# Patient Record
Sex: Male | Born: 1990 | Hispanic: No | Marital: Single | Smoking: Former smoker
Health system: Southern US, Community
[De-identification: ages and names within clinical notes are randomized; demographics above are authoritative.]

## PROBLEM LIST (undated history)

## (undated) DIAGNOSIS — F419 Anxiety disorder, unspecified: Secondary | ICD-10-CM

---
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
## 2011-03-25 ENCOUNTER — Emergency Department (INDEPENDENT_AMBULATORY_CARE_PROVIDER_SITE_OTHER): Payer: Self-pay

## 2011-03-25 ENCOUNTER — Encounter (HOSPITAL_BASED_OUTPATIENT_CLINIC_OR_DEPARTMENT_OTHER): Payer: Self-pay | Admitting: *Deleted

## 2011-03-25 ENCOUNTER — Emergency Department (HOSPITAL_BASED_OUTPATIENT_CLINIC_OR_DEPARTMENT_OTHER)
Admission: EM | Admit: 2011-03-25 | Discharge: 2011-03-25 | Disposition: A | Discharge status: Home / Self Care | Payer: Self-pay | Attending: Emergency Medicine | Admitting: Emergency Medicine

## 2011-03-25 DIAGNOSIS — S71109A Unspecified open wound, unspecified thigh, initial encounter: Secondary | ICD-10-CM | POA: Insufficient documentation

## 2011-03-25 DIAGNOSIS — M79609 Pain in unspecified limb: Secondary | ICD-10-CM

## 2011-03-25 DIAGNOSIS — T148XXA Other injury of unspecified body region, initial encounter: Secondary | ICD-10-CM

## 2011-03-25 DIAGNOSIS — S71009A Unspecified open wound, unspecified hip, initial encounter: Secondary | ICD-10-CM | POA: Insufficient documentation

## 2011-03-25 DIAGNOSIS — S71112A Laceration without foreign body, left thigh, initial encounter: Secondary | ICD-10-CM

## 2011-03-25 MED ORDER — LIDOCAINE-EPINEPHRINE 2 %-1:100000 IJ SOLN
30.0000 mL | Freq: Once | INTRAMUSCULAR | Status: AC
  Administered 2011-03-25: 20 mL
  Filled 2011-03-25: qty 1

## 2011-03-25 MED ORDER — TETANUS-DIPHTH-ACELL PERTUSSIS 5-2.5-18.5 LF-MCG/0.5 IM SUSP
INTRAMUSCULAR | Status: AC
  Administered 2011-03-25: 0.5 mL via INTRAMUSCULAR
  Filled 2011-03-25: qty 0.5

## 2011-03-25 MED ORDER — TETANUS-DIPHTH-ACELL PERTUSSIS 5-2.5-18.5 LF-MCG/0.5 IM SUSP
0.5000 mL | Freq: Once | INTRAMUSCULAR | Status: AC
  Administered 2011-03-25: 0.5 mL via INTRAMUSCULAR

## 2011-03-25 MED ORDER — TETANUS-DIPHTH-ACELL PERTUSSIS 5-2-15.5 LF-MCG/0.5 IM SUSP: 0.5000 mL | Freq: Once | INTRAMUSCULAR | Status: DC

## 2011-03-25 NOTE — ED Notes (Signed)
Toe lac not present today.  Not charted as resolved from previous encounter

## 2011-03-25 NOTE — ED Notes (Addendum)
[employer] at bedside to speak with pt.  [facility] has been called by [facility]
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
## 2011-04-05 ENCOUNTER — Emergency Department (HOSPITAL_BASED_OUTPATIENT_CLINIC_OR_DEPARTMENT_OTHER)
Admission: EM | Admit: 2011-04-05 | Discharge: 2011-04-06 | Discharge status: Against Medical Advice | Payer: Self-pay | Attending: Emergency Medicine | Admitting: Emergency Medicine

## 2011-04-05 ENCOUNTER — Encounter (HOSPITAL_BASED_OUTPATIENT_CLINIC_OR_DEPARTMENT_OTHER): Payer: Self-pay | Admitting: *Deleted

## 2011-04-05 DIAGNOSIS — Z4802 Encounter for removal of sutures: Secondary | ICD-10-CM | POA: Insufficient documentation

## 2011-04-05 NOTE — ED Notes (Signed)
Called in.

## 2011-04-05 NOTE — ED Notes (Signed)
Pt seen here Dec 12th and staples were placed in his left hip. Verbalizes no complaints.

## 2011-04-05 NOTE — ED Notes (Signed)
Called in lobby and outside. No answer. 

## 2012-02-24 ENCOUNTER — Emergency Department (HOSPITAL_BASED_OUTPATIENT_CLINIC_OR_DEPARTMENT_OTHER)
Admission: EM | Admit: 2012-02-24 | Discharge: 2012-02-24 | Disposition: A | Discharge status: Home / Self Care | Payer: Self-pay | Attending: Emergency Medicine | Admitting: Emergency Medicine

## 2012-02-24 ENCOUNTER — Encounter (HOSPITAL_BASED_OUTPATIENT_CLINIC_OR_DEPARTMENT_OTHER): Payer: Self-pay | Admitting: *Deleted

## 2012-02-24 DIAGNOSIS — M542 Cervicalgia: Secondary | ICD-10-CM | POA: Insufficient documentation

## 2012-02-24 DIAGNOSIS — K921 Melena: Secondary | ICD-10-CM | POA: Insufficient documentation

## 2012-02-24 DIAGNOSIS — R112 Nausea with vomiting, unspecified: Secondary | ICD-10-CM | POA: Insufficient documentation

## 2012-02-24 DIAGNOSIS — J0300 Acute streptococcal tonsillitis, unspecified: Secondary | ICD-10-CM

## 2012-02-24 DIAGNOSIS — F17200 Nicotine dependence, unspecified, uncomplicated: Secondary | ICD-10-CM | POA: Insufficient documentation

## 2012-02-24 DIAGNOSIS — R52 Pain, unspecified: Secondary | ICD-10-CM | POA: Insufficient documentation

## 2012-02-24 DIAGNOSIS — IMO0001 Reserved for inherently not codable concepts without codable children: Secondary | ICD-10-CM | POA: Insufficient documentation

## 2012-02-24 DIAGNOSIS — J020 Streptococcal pharyngitis: Secondary | ICD-10-CM | POA: Insufficient documentation

## 2012-02-24 DIAGNOSIS — K5289 Other specified noninfective gastroenteritis and colitis: Secondary | ICD-10-CM | POA: Insufficient documentation

## 2012-02-24 DIAGNOSIS — R5381 Other malaise: Secondary | ICD-10-CM | POA: Insufficient documentation

## 2012-02-24 DIAGNOSIS — K529 Noninfective gastroenteritis and colitis, unspecified: Secondary | ICD-10-CM

## 2012-02-24 DIAGNOSIS — R197 Diarrhea, unspecified: Secondary | ICD-10-CM | POA: Insufficient documentation

## 2012-02-24 DIAGNOSIS — J029 Acute pharyngitis, unspecified: Secondary | ICD-10-CM | POA: Insufficient documentation

## 2012-02-24 LAB — CBC WITH DIFFERENTIAL/PLATELET
Basophils Absolute: 0 10*3/uL (ref 0.0–0.1)
Basophils Relative: 0 % (ref 0–1)
Eosinophils Absolute: 0.3 10*3/uL (ref 0.0–0.7)
Hemoglobin: 14.7 g/dL (ref 13.0–17.0)
Lymphocytes Relative: 7 % — ABNORMAL LOW (ref 12–46)
MCH: 28.1 pg (ref 26.0–34.0)
MCHC: 35.7 g/dL (ref 30.0–36.0)
Monocytes Absolute: 1.7 10*3/uL — ABNORMAL HIGH (ref 0.1–1.0)
Neutrophils Relative %: 81 % — ABNORMAL HIGH (ref 43–77)
RDW: 13.6 % (ref 11.5–15.5)

## 2012-02-24 LAB — COMPREHENSIVE METABOLIC PANEL
ALT: 20 U/L (ref 0–53)
AST: 23 U/L (ref 0–37)
Albumin: 4 g/dL (ref 3.5–5.2)
Alkaline Phosphatase: 80 U/L (ref 39–117)
BUN: 9 mg/dL (ref 6–23)
Chloride: 99 mEq/L (ref 96–112)
Potassium: 3.1 mEq/L — ABNORMAL LOW (ref 3.5–5.1)
Sodium: 137 mEq/L (ref 135–145)
Total Bilirubin: 0.6 mg/dL (ref 0.3–1.2)
Total Protein: 8 g/dL (ref 6.0–8.3)

## 2012-02-24 LAB — LIPASE, BLOOD: Lipase: 23 U/L (ref 11–59)

## 2012-02-24 MED ORDER — KETOROLAC TROMETHAMINE 30 MG/ML IJ SOLN
30.0000 mg | Freq: Once | INTRAMUSCULAR | Status: AC
  Administered 2012-02-24: 30 mg via INTRAVENOUS
  Filled 2012-02-24: qty 1

## 2012-02-24 MED ORDER — PENICILLIN V POTASSIUM 500 MG PO TABS: 500.0000 mg | ORAL_TABLET | Freq: Two times a day (BID) | ORAL | Status: AC

## 2012-02-24 MED ORDER — DEXAMETHASONE SODIUM PHOSPHATE 10 MG/ML IJ SOLN
10.0000 mg | Freq: Once | INTRAMUSCULAR | Status: AC
  Administered 2012-02-24: 10 mg via INTRAVENOUS
  Filled 2012-02-24: qty 1

## 2012-02-24 MED ORDER — SODIUM CHLORIDE 0.9 % IV BOLUS (SEPSIS)
1000.0000 mL | Freq: Once | INTRAVENOUS | Status: AC
  Administered 2012-02-24: 1000 mL via INTRAVENOUS

## 2012-02-24 MED ORDER — IBUPROFEN 400 MG PO TABS: 400.0000 mg | ORAL_TABLET | Freq: Four times a day (QID) | ORAL | Status: DC | PRN

## 2012-02-24 MED ORDER — ONDANSETRON 4 MG PO TBDP: 4.0000 mg | ORAL_TABLET | Freq: Three times a day (TID) | ORAL | Status: DC | PRN

## 2012-02-24 MED ORDER — POTASSIUM CHLORIDE CRYS ER 20 MEQ PO TBCR
20.0000 meq | EXTENDED_RELEASE_TABLET | Freq: Once | ORAL | Status: AC
  Administered 2012-02-24: 20 meq via ORAL
  Filled 2012-02-24: qty 1

## 2012-02-24 MED ORDER — ONDANSETRON HCL 4 MG/2ML IJ SOLN
4.0000 mg | Freq: Once | INTRAMUSCULAR | Status: AC
  Administered 2012-02-24: 4 mg via INTRAVENOUS
  Filled 2012-02-24: qty 2

## 2012-02-24 MED ORDER — CIPROFLOXACIN HCL 500 MG PO TABS: 500.0000 mg | ORAL_TABLET | Freq: Two times a day (BID) | ORAL | Status: DC

## 2012-02-24 NOTE — ED Notes (Signed)
Pt c/o flu-like symptoms x3 days

## 2012-02-24 NOTE — ED Notes (Signed)
Chart reviewed.

## 2012-02-24 NOTE — ED Provider Notes (Signed)
History     CSN: [id number]  Arrival date & time 02/24/12  1757   First MD Initiated Contact with Patient 02/24/12 1820      Chief Complaint  Patient presents with  . Fever  . Generalized Body Aches    (Consider location/radiation/quality/duration/timing/severity/associated sxs/prior treatment) HPI Pt states 2 days ago he went to a cookout and ate grilled chicken and potato salad. Yesterday morning he had multiple episodes of vomiting, several episodes of diarrhea, fever and chills. On loose stool had blood mixed in. Stools since have been normal. No current bleeding. No abdominal pain. Pt denied URI symptoms, cough, SOB, CP, abd pain. Admitted to 2 days of sore throat and myalgias. No sick contacts.  History reviewed. No pertinent past medical history.  History reviewed. No pertinent past surgical history.  History reviewed. No pertinent family history.  History  Substance Use Topics  . Smoking status: Smoker, Current Status Unknown -- 0.5 packs/day    Types: Cigarettes  . Smokeless tobacco: Not on file  . Alcohol Use: No      Review of Systems  Constitutional: Positive for fever, chills and fatigue.  HENT: Positive for sore throat and neck pain. Negative for rhinorrhea, sneezing, neck stiffness, voice change and sinus pressure.   Respiratory: Negative for cough, shortness of breath and wheezing.   Cardiovascular: Negative for chest pain, palpitations and leg swelling.  Gastrointestinal: Positive for nausea, vomiting, diarrhea and blood in stool. Negative for abdominal pain, constipation and abdominal distention.  Genitourinary: Negative for dysuria and flank pain.  Musculoskeletal: Positive for myalgias. Negative for back pain and arthralgias.  Neurological: Negative for dizziness, syncope, weakness, numbness and headaches.    Allergies  Shellfish allergy  Home Medications   Current Outpatient Rx  Name  Route  Sig  Dispense  Refill  . CIPROFLOXACIN HCL 500 MG PO  TABS   Oral   Take 1 tablet (500 mg total) by mouth 2 (two) times daily.   6 tablet   0   . IBUPROFEN 400 MG PO TABS   Oral   Take 1 tablet (400 mg total) by mouth every 6 (six) hours as needed for pain.   30 tablet   0   . ONDANSETRON 4 MG PO TBDP   Oral   Take 1 tablet (4 mg total) by mouth every 8 (eight) hours as needed for nausea.   20 tablet   0   . PENICILLIN V POTASSIUM 500 MG PO TABS   Oral   Take 1 tablet (500 mg total) by mouth 2 (two) times daily.   20 tablet   0     BP 123/69  Pulse 100  Temp 99.1 F (37.3 C) (Oral)  Resp 16  Ht 6\' 3"  (1.905 m)  Wt 238 lb (107.956 kg)  BMI 29.75 kg/m2  SpO2 98%  Physical Exam  Nursing note and vitals reviewed. Constitutional: He is oriented to person, place, and time. He appears well-developed and well-nourished. No distress.  HENT:  Head: Normocephalic and atraumatic.       bl tosillar exudate. No obvious PTA or uvular deviation  Eyes: EOM are normal. Pupils are equal, round, and reactive to light.  Neck: Normal range of motion. Neck supple.  Cardiovascular: Normal rate and regular rhythm.   Pulmonary/Chest: Effort normal and breath sounds normal. No respiratory distress. He has no wheezes. He has no rales.  Abdominal: Soft. Bowel sounds are normal. He exhibits no distension and no mass. There is no  tenderness. There is no rebound and no guarding.  Musculoskeletal: Normal range of motion. He exhibits no edema and no tenderness.  Lymphadenopathy:    He has cervical adenopathy.  Neurological: He is alert and oriented to person, place, and time.       5/5 motor in all ext. Sensation intact  Skin: Skin is warm and dry. No rash noted. No erythema.  Psychiatric: He has a normal mood and affect. His behavior is normal.    ED Course  Procedures (including critical care time)  Labs Reviewed  CBC WITH DIFFERENTIAL - Abnormal; Notable for the following:    WBC 17.0 (*)     Neutrophils Relative 81 (*)     Lymphocytes  Relative 7 (*)     Neutro Abs 13.8 (*)     Monocytes Absolute 1.7 (*)     All other components within normal limits  COMPREHENSIVE METABOLIC PANEL - Abnormal; Notable for the following:    Potassium 3.1 (*)     All other components within normal limits  LIPASE, BLOOD   No results found.   1. Strep tonsillitis   2. Enteritis       MDM  Pt states he is feeling much better after meds and fluids. Strep throat and I suspect possible food-bourne bacterial enteritis. Will treat for both. F/u for worsening symptoms or concerns        Loren Racer, MD 02/24/12 2102

## 2012-05-14 ENCOUNTER — Emergency Department (HOSPITAL_COMMUNITY)
Admission: EM | Admit: 2012-05-14 | Discharge: 2012-05-14 | Disposition: A | Discharge status: Home / Self Care | Payer: Self-pay | Attending: Emergency Medicine | Admitting: Emergency Medicine

## 2012-05-14 ENCOUNTER — Emergency Department (HOSPITAL_COMMUNITY): Payer: Self-pay

## 2012-05-14 ENCOUNTER — Encounter (HOSPITAL_COMMUNITY): Payer: Self-pay | Admitting: Physical Medicine and Rehabilitation

## 2012-05-14 DIAGNOSIS — R0681 Apnea, not elsewhere classified: Secondary | ICD-10-CM | POA: Insufficient documentation

## 2012-05-14 DIAGNOSIS — S022XXA Fracture of nasal bones, initial encounter for closed fracture: Secondary | ICD-10-CM | POA: Insufficient documentation

## 2012-05-14 DIAGNOSIS — R404 Transient alteration of awareness: Secondary | ICD-10-CM | POA: Insufficient documentation

## 2012-05-14 DIAGNOSIS — F17200 Nicotine dependence, unspecified, uncomplicated: Secondary | ICD-10-CM | POA: Insufficient documentation

## 2012-05-14 DIAGNOSIS — F1010 Alcohol abuse, uncomplicated: Secondary | ICD-10-CM | POA: Insufficient documentation

## 2012-05-14 MED ORDER — OXYCODONE-ACETAMINOPHEN 5-325 MG PO TABS: 1.0000 | ORAL_TABLET | ORAL | Status: DC | PRN

## 2012-05-14 MED ORDER — OXYCODONE-ACETAMINOPHEN 5-325 MG PO TABS
2.0000 | ORAL_TABLET | Freq: Once | ORAL | Status: AC
  Administered 2012-05-14: 2 via ORAL
  Filled 2012-05-14: qty 2

## 2012-05-14 MED ORDER — OXYMETAZOLINE HCL 0.05 % NA SOLN
1.0000 | Freq: Once | NASAL | Status: AC
  Administered 2012-05-14: 1 via NASAL
  Filled 2012-05-14: qty 15
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
## 2012-05-15 NOTE — ED Provider Notes (Signed)
Medical screening examination/treatment/procedure(s) were performed by non-physician practitioner and as supervising physician I was immediately available for consultation/collaboration.    Nelia Shi, MD 05/15/12 [phone]

## 2012-12-19 ENCOUNTER — Emergency Department (HOSPITAL_BASED_OUTPATIENT_CLINIC_OR_DEPARTMENT_OTHER)
Admission: EM | Admit: 2012-12-19 | Discharge: 2012-12-19 | Disposition: A | Discharge status: Home / Self Care | Payer: Self-pay | Attending: Emergency Medicine | Admitting: Emergency Medicine

## 2012-12-19 ENCOUNTER — Encounter (HOSPITAL_BASED_OUTPATIENT_CLINIC_OR_DEPARTMENT_OTHER): Payer: Self-pay | Admitting: *Deleted

## 2012-12-19 DIAGNOSIS — F17200 Nicotine dependence, unspecified, uncomplicated: Secondary | ICD-10-CM | POA: Insufficient documentation

## 2012-12-19 DIAGNOSIS — J029 Acute pharyngitis, unspecified: Secondary | ICD-10-CM

## 2012-12-19 DIAGNOSIS — R112 Nausea with vomiting, unspecified: Secondary | ICD-10-CM | POA: Insufficient documentation

## 2012-12-19 DIAGNOSIS — IMO0001 Reserved for inherently not codable concepts without codable children: Secondary | ICD-10-CM | POA: Insufficient documentation

## 2012-12-19 DIAGNOSIS — R509 Fever, unspecified: Secondary | ICD-10-CM | POA: Insufficient documentation

## 2012-12-19 MED ORDER — ONDANSETRON HCL 4 MG PO TABS: 4.0000 mg | ORAL_TABLET | Freq: Three times a day (TID) | ORAL | Status: DC | PRN

## 2012-12-19 MED ORDER — ONDANSETRON 4 MG PO TBDP
4.0000 mg | ORAL_TABLET | Freq: Once | ORAL | Status: AC
  Administered 2012-12-19: 4 mg via ORAL
  Filled 2012-12-19: qty 1

## 2012-12-19 NOTE — ED Provider Notes (Signed)
CSN: [id number]     Arrival date & time 12/19/12  4098 History   First MD Initiated Contact with Patient 12/19/12 [phone]     Chief Complaint  Patient presents with  . fever chills bodyaches   . Sore Throat   (Consider location/radiation/quality/duration/timing/severity/associated sxs/prior Treatment) Patient is a 22 y.o. male presenting with pharyngitis.  Sore Throat   Pt reports 2 days of moderate aching sore throat worse with swallowing, nausea and subjective fever. Vomiting twice yesterday and no appetite today. Has had strep pharyngitis in the past.   History reviewed. No pertinent past medical history. History reviewed. No pertinent past surgical history. History reviewed. No pertinent family history. History  Substance Use Topics  . Smoking status: Smoker, Current Status Unknown -- 0.50 packs/day    Types: Cigarettes  . Smokeless tobacco: Not on file  . Alcohol Use: No    Review of Systems All other systems reviewed and are negative except as noted in HPI.   Allergies  Shellfish allergy  Home Medications   Current Outpatient Rx  Name  Route  Sig  Dispense  Refill  . oxyCODONE-acetaminophen (PERCOCET/ROXICET) 5-325 MG per tablet   Oral   Take 1 tablet by mouth every 4 (four) hours as needed for pain. May take 2 tablets PO q 6 hours for severe pain - Do not take with Tylenol as this tablet already contains tylenol   15 tablet   0    BP 120/73  Pulse 92  Temp(Src) 98.5 F (36.9 C) (Oral)  Resp 20  SpO2 100% Physical Exam  Nursing note and vitals reviewed. Constitutional: He is oriented to person, place, and time. He appears well-developed and well-nourished.  HENT:  Head: Normocephalic and atraumatic.  Moderate tonsillar swelling, mild erythema, no exudate  Eyes: EOM are normal. Pupils are equal, round, and reactive to light.  Neck: Normal range of motion. Neck supple.  Cardiovascular: Normal rate, normal heart sounds and intact distal pulses.    Pulmonary/Chest: Effort normal and breath sounds normal. He has no wheezes. He has no rales.  Abdominal: Bowel sounds are normal. He exhibits no distension. There is no tenderness.  Musculoskeletal: Normal range of motion. He exhibits no edema and no tenderness.  Lymphadenopathy:    He has no cervical adenopathy.  Neurological: He is alert and oriented to person, place, and time. He has normal strength. No cranial nerve deficit or sensory deficit.  Skin: Skin is warm and dry. No rash noted.  Psychiatric: He has a normal mood and affect.    ED Course  Procedures (including critical care time) Labs Review Labs Reviewed  RAPID STREP SCREEN   Imaging Review No results found.  MDM   1. Viral pharyngitis     Strep neg, likely a non-specific viral illness. Advised symptomatic OTC care. Zofran Rx as needed for nausea.    Charles B. Bernette Mayers, MD 12/19/12 [phone]

## 2012-12-19 NOTE — ED Notes (Signed)
Pt reports yesterday started having sore throat fever and chills vomited x 2 yesterday no vomiting today

## 2012-12-20 ENCOUNTER — Emergency Department (HOSPITAL_BASED_OUTPATIENT_CLINIC_OR_DEPARTMENT_OTHER)
Admission: EM | Admit: 2012-12-20 | Discharge: 2012-12-20 | Disposition: A | Discharge status: Home / Self Care | Payer: Self-pay | Attending: Emergency Medicine | Admitting: Emergency Medicine

## 2012-12-20 ENCOUNTER — Encounter (HOSPITAL_BASED_OUTPATIENT_CLINIC_OR_DEPARTMENT_OTHER): Payer: Self-pay | Admitting: *Deleted

## 2012-12-20 DIAGNOSIS — R509 Fever, unspecified: Secondary | ICD-10-CM | POA: Insufficient documentation

## 2012-12-20 DIAGNOSIS — F17200 Nicotine dependence, unspecified, uncomplicated: Secondary | ICD-10-CM | POA: Insufficient documentation

## 2012-12-20 DIAGNOSIS — R110 Nausea: Secondary | ICD-10-CM | POA: Insufficient documentation

## 2012-12-20 DIAGNOSIS — R630 Anorexia: Secondary | ICD-10-CM | POA: Insufficient documentation

## 2012-12-20 DIAGNOSIS — IMO0001 Reserved for inherently not codable concepts without codable children: Secondary | ICD-10-CM | POA: Insufficient documentation

## 2012-12-20 DIAGNOSIS — R0602 Shortness of breath: Secondary | ICD-10-CM | POA: Insufficient documentation

## 2012-12-20 DIAGNOSIS — J039 Acute tonsillitis, unspecified: Secondary | ICD-10-CM | POA: Insufficient documentation

## 2012-12-20 DIAGNOSIS — R42 Dizziness and giddiness: Secondary | ICD-10-CM | POA: Insufficient documentation

## 2012-12-20 MED ORDER — PENICILLIN G BENZATHINE 1200000 UNIT/2ML IM SUSP
1.2000 10*6.[IU] | Freq: Once | INTRAMUSCULAR | Status: AC
  Administered 2012-12-20: 1.2 10*6.[IU] via INTRAMUSCULAR
  Filled 2012-12-20: qty 2

## 2012-12-20 MED ORDER — TRAMADOL HCL 50 MG PO TABS: 50.0000 mg | ORAL_TABLET | Freq: Four times a day (QID) | ORAL | Status: DC | PRN

## 2012-12-20 MED ORDER — PROMETHAZINE HCL 25 MG PO TABS: 25.0000 mg | ORAL_TABLET | Freq: Four times a day (QID) | ORAL | Status: DC | PRN

## 2012-12-20 MED ORDER — DEXAMETHASONE SODIUM PHOSPHATE 10 MG/ML IJ SOLN
10.0000 mg | Freq: Once | INTRAMUSCULAR | Status: AC
  Administered 2012-12-20: 10 mg via INTRAMUSCULAR
  Filled 2012-12-20: qty 1

## 2012-12-20 NOTE — ED Provider Notes (Signed)
CSN: [id number]     Arrival date & time 12/20/12  2142 History  This chart was scribed for Gilda Crease, MD by Bennett Scrape, ED Scribe. This patient was seen in room MH01/MH01 and the patient's care was started at 10:13 PM.   Chief Complaint  Patient presents with  . Sore Throat    The history is provided by the patient. No language interpreter was used.    HPI Comments: Jeremy Horn is a 22 y.o. male who presents to the Emergency Department complaining of persistent, worsening sore throat that has been persistent for the past 3 days. He was seen yesterday for the same and diagnosed with a viral illness. He admits that he did not get the Zofran filled due to cost. He is back today after developing a "bump" on his throat. He reports associated decreased appetite, myalgias, nausea, SOB, fever, chills and dizziness with standing. Temperature is 99.8 in the ED. He denies abdominal pain and emesis as associated symptoms. Pt does not have a h/o chronic medical conditions.    History reviewed. No pertinent past medical history. History reviewed. No pertinent past surgical history. History reviewed. No pertinent family history. History  Substance Use Topics  . Smoking status: Smoker, Current Status Unknown -- 0.50 packs/day    Types: Cigarettes  . Smokeless tobacco: Not on file  . Alcohol Use: No    Review of Systems  Constitutional: Positive for fever, chills and appetite change.  HENT: Positive for sore throat.   Respiratory: Positive for shortness of breath. Negative for cough.   Gastrointestinal: Positive for nausea. Negative for vomiting.  Musculoskeletal: Positive for myalgias.  Neurological: Positive for dizziness. Negative for headaches.  All other systems reviewed and are negative.    Allergies  Shellfish allergy  Home Medications   Current Outpatient Rx  Name  Route  Sig  Dispense  Refill  . ondansetron (ZOFRAN) 4 MG tablet   Oral   Take 1 tablet (4  mg total) by mouth every 8 (eight) hours as needed for nausea.   12 tablet   0    Triage Vitals: BP 112/70  Pulse 110  Temp(Src) 99.8 F (37.7 C) (Oral)  Resp 16  Ht 6\' 3"  (1.905 m)  Wt 239 lb (108.41 kg)  BMI 29.87 kg/m2  SpO2 100%  Physical Exam  Nursing note and vitals reviewed. Constitutional: He is oriented to person, place, and time. He appears well-developed and well-nourished. No distress.  HENT:  Head: Normocephalic and atraumatic.  Right Ear: Hearing normal.  Left Ear: Hearing normal.  Nose: Nose normal.  Mouth/Throat: Mucous membranes are normal.  fairly significant tonsillar exudate with peritonsillar swelling, no abscess  Eyes: Conjunctivae and EOM are normal. Pupils are equal, round, and reactive to light.  Neck: Normal range of motion. Neck supple.  Cardiovascular: Regular rhythm, S1 normal and S2 normal.  Exam reveals no gallop and no friction rub.   No murmur heard. Pulmonary/Chest: Effort normal and breath sounds normal. No respiratory distress. He exhibits no tenderness.  Musculoskeletal: Normal range of motion.  Lymphadenopathy:    He has cervical adenopathy.  Neurological: He is alert and oriented to person, place, and time. He has normal strength. No cranial nerve deficit or sensory deficit. Coordination normal. GCS eye subscore is 4. GCS verbal subscore is 5. GCS motor subscore is 6.  Skin: Skin is warm, dry and intact. No rash noted. No cyanosis.  Psychiatric: He has a normal mood and affect. His speech  is normal and behavior is normal. Thought content normal.    ED Course  Procedures (including critical care time)  DIAGNOSTIC STUDIES: Oxygen Saturation is 100% on room air, normal by my interpretation.    COORDINATION OF CARE: 10:16 PM-Discussed treatment plan which includes Tylenol for pain, cough medication and other medications with pt at bedside and pt agreed to plan.   Labs Review Labs Reviewed - No data to display Imaging Review No  results found.  MDM  Diagnosis: Pharyngitis, tonsillitis  Patient presents to the ER for evaluation of continued problems with sore throat. Patient seen yesterday, rapid strep was negative. Patient reports that he continues to have fever, generalized aches and a sore throat. He is complaining of pain with swallowing. Examination reveals significant exudative tonsillitis. Patient treated with Decadron and Bicillin.  I personally performed the services described in this documentation, which was scribed in my presence. The recorded information has been reviewed and is accurate.     Gilda Crease, MD 12/20/12 2226

## 2012-12-20 NOTE — ED Notes (Signed)
Se here yesterday for same , back today for increased sore throat and chills, dizziness

## 2012-12-21 LAB — CULTURE, GROUP A STREP

## 2013-07-06 ENCOUNTER — Encounter (HOSPITAL_BASED_OUTPATIENT_CLINIC_OR_DEPARTMENT_OTHER): Payer: Self-pay | Admitting: Emergency Medicine

## 2013-07-06 ENCOUNTER — Emergency Department (HOSPITAL_BASED_OUTPATIENT_CLINIC_OR_DEPARTMENT_OTHER)
Admission: EM | Admit: 2013-07-06 | Discharge: 2013-07-06 | Disposition: A | Discharge status: Home / Self Care | Payer: Self-pay | Attending: Emergency Medicine | Admitting: Emergency Medicine

## 2013-07-06 DIAGNOSIS — F17200 Nicotine dependence, unspecified, uncomplicated: Secondary | ICD-10-CM | POA: Insufficient documentation

## 2013-07-06 DIAGNOSIS — R1110 Vomiting, unspecified: Secondary | ICD-10-CM | POA: Insufficient documentation

## 2013-07-06 DIAGNOSIS — R197 Diarrhea, unspecified: Secondary | ICD-10-CM | POA: Insufficient documentation

## 2013-07-06 LAB — BASIC METABOLIC PANEL
BUN: 11 mg/dL (ref 6–23)
CALCIUM: 9.3 mg/dL (ref 8.4–10.5)
CO2: 23 meq/L (ref 19–32)
CREATININE: 0.8 mg/dL (ref 0.50–1.35)
Chloride: 102 mEq/L (ref 96–112)
GFR calc Af Amer: >90 mL/min (ref >90)
GFR calc non Af Amer: >90 mL/min (ref >90)
GLUCOSE: 120 mg/dL — AB (ref 70–99)
Potassium: 3.4 mEq/L — ABNORMAL LOW (ref 3.7–5.3)
SODIUM: 139 meq/L (ref 137–147)

## 2013-07-06 MED ORDER — ONDANSETRON 4 MG PO TBDP: 4.0000 mg | ORAL_TABLET | Freq: Three times a day (TID) | ORAL | Status: DC | PRN

## 2013-07-06 MED ORDER — ONDANSETRON HCL 4 MG/2ML IJ SOLN
4.0000 mg | Freq: Once | INTRAMUSCULAR | Status: AC
  Administered 2013-07-06: 4 mg via INTRAVENOUS
  Filled 2013-07-06: qty 2

## 2013-07-06 MED ORDER — DICYCLOMINE HCL 10 MG/ML IM SOLN
20.0000 mg | Freq: Once | INTRAMUSCULAR | Status: AC
  Administered 2013-07-06: 20 mg via INTRAMUSCULAR
  Filled 2013-07-06: qty 2

## 2013-07-06 MED ORDER — SODIUM CHLORIDE 0.9 % IV BOLUS (SEPSIS)
1000.0000 mL | Freq: Once | INTRAVENOUS | Status: AC
  Administered 2013-07-06: 1000 mL via INTRAVENOUS

## 2013-07-06 NOTE — Discharge Instructions (Signed)

## 2013-07-06 NOTE — ED Provider Notes (Signed)
Medical screening examination/treatment/procedure(s) were performed by non-physician practitioner and as supervising physician I was immediately available for consultation/collaboration.   EKG Interpretation None        Joshuajames Moehring, MD 07/06/13 1817 

## 2013-07-06 NOTE — ED Provider Notes (Signed)
CSN: [id number]     Arrival date & time 07/06/13  1609 History   First MD Initiated Contact with Patient 07/06/13 1619     Chief Complaint  Patient presents with  . Abdominal Pain     (Consider location/radiation/quality/duration/timing/severity/associated sxs/prior Treatment) HPI Comments: Pt c/o vomiting and diarrhea that started last night about 1 hour after eating. States that he has been unable to keep much down today. Diarrhea has stopped. Denies fever. Having diffuse cramping. Tried pepto without any problem  The history is provided by the patient. No language interpreter was used.    History reviewed. No pertinent past medical history. History reviewed. No pertinent past surgical history. No family history on file. History  Substance Use Topics  . Smoking status: Smoker, Current Status Unknown -- 0.50 packs/day    Types: Cigarettes  . Smokeless tobacco: Not on file  . Alcohol Use: No    Review of Systems  Constitutional: Negative.   Respiratory: Negative.   Cardiovascular: Negative.       Allergies  Shellfish allergy  Home Medications  No current outpatient prescriptions on file. BP 120/67  Pulse 89  Temp(Src) 98.1 F (36.7 C) (Oral)  Resp 18  Ht 6\' 3"  (1.905 m)  Wt 263 lb (119.296 kg)  BMI 32.87 kg/m2  SpO2 100% Physical Exam  Nursing note and vitals reviewed. Constitutional: He is oriented to person, place, and time. He appears well-developed and well-nourished.  Cardiovascular: Normal rate and regular rhythm.   Pulmonary/Chest: Effort normal and breath sounds normal.  Abdominal: Soft. Bowel sounds are normal. There is no tenderness.  Musculoskeletal: Normal range of motion.  Neurological: He is alert and oriented to person, place, and time.  Skin: Skin is warm and dry.    ED Course  Procedures (including critical care time) Labs Review Labs Reviewed  BASIC METABOLIC PANEL - Abnormal; Notable for the following:    Potassium 3.4 (*)    Glucose, Bld 120 (*)    All other components within normal limits   Imaging Review No results found.   EKG Interpretation None      MDM   Final diagnoses:  Vomiting and diarrhea    Pt is feeling better and tolerating po after fluids and zofran. Likely viral.     Teressa LowerVrinda Tasnim Balentine, NP 07/06/13 1742
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
## 2013-07-25 ENCOUNTER — Encounter (HOSPITAL_BASED_OUTPATIENT_CLINIC_OR_DEPARTMENT_OTHER): Payer: Self-pay | Admitting: Emergency Medicine

## 2013-07-25 DIAGNOSIS — R05 Cough: Secondary | ICD-10-CM | POA: Insufficient documentation

## 2013-07-25 DIAGNOSIS — F17200 Nicotine dependence, unspecified, uncomplicated: Secondary | ICD-10-CM | POA: Insufficient documentation

## 2013-07-25 DIAGNOSIS — R059 Cough, unspecified: Secondary | ICD-10-CM | POA: Insufficient documentation

## 2013-07-25 DIAGNOSIS — R1110 Vomiting, unspecified: Secondary | ICD-10-CM | POA: Insufficient documentation

## 2013-07-25 DIAGNOSIS — R197 Diarrhea, unspecified: Secondary | ICD-10-CM | POA: Insufficient documentation

## 2013-07-25 MED ORDER — ACETAMINOPHEN 325 MG PO TABS
650.0000 mg | ORAL_TABLET | Freq: Once | ORAL | Status: AC
  Administered 2013-07-25: 650 mg via ORAL
  Filled 2013-07-25: qty 2

## 2013-07-25 MED ORDER — ONDANSETRON 8 MG PO TBDP
8.0000 mg | ORAL_TABLET | Freq: Once | ORAL | Status: AC
  Administered 2013-07-25: 8 mg via ORAL
  Filled 2013-07-25: qty 1

## 2013-07-25 NOTE — ED Notes (Signed)
Vomited x 2, diarrhea x3. Cough and cold sxs as well.

## 2013-07-26 ENCOUNTER — Emergency Department (HOSPITAL_BASED_OUTPATIENT_CLINIC_OR_DEPARTMENT_OTHER)
Admission: EM | Admit: 2013-07-26 | Discharge: 2013-07-26 | Discharge status: Against Medical Advice | Payer: Self-pay | Attending: Emergency Medicine | Admitting: Emergency Medicine

## 2013-07-27 ENCOUNTER — Encounter (HOSPITAL_BASED_OUTPATIENT_CLINIC_OR_DEPARTMENT_OTHER): Payer: Self-pay | Admitting: Emergency Medicine

## 2013-07-27 ENCOUNTER — Emergency Department (HOSPITAL_BASED_OUTPATIENT_CLINIC_OR_DEPARTMENT_OTHER)
Admission: EM | Admit: 2013-07-27 | Discharge: 2013-07-27 | Disposition: A | Discharge status: Home / Self Care | Payer: Self-pay | Attending: Emergency Medicine | Admitting: Emergency Medicine

## 2013-07-27 DIAGNOSIS — B349 Viral infection, unspecified: Secondary | ICD-10-CM

## 2013-07-27 DIAGNOSIS — R110 Nausea: Secondary | ICD-10-CM | POA: Insufficient documentation

## 2013-07-27 DIAGNOSIS — J3489 Other specified disorders of nose and nasal sinuses: Secondary | ICD-10-CM | POA: Insufficient documentation

## 2013-07-27 DIAGNOSIS — F17200 Nicotine dependence, unspecified, uncomplicated: Secondary | ICD-10-CM | POA: Insufficient documentation

## 2013-07-27 DIAGNOSIS — R197 Diarrhea, unspecified: Secondary | ICD-10-CM | POA: Insufficient documentation

## 2013-07-27 DIAGNOSIS — B9789 Other viral agents as the cause of diseases classified elsewhere: Secondary | ICD-10-CM | POA: Insufficient documentation

## 2013-07-27 DIAGNOSIS — R509 Fever, unspecified: Secondary | ICD-10-CM | POA: Insufficient documentation

## 2013-07-27 MED ORDER — GUAIFENESIN-CODEINE 100-10 MG/5ML PO SOLN: 10.0000 mL | Freq: Four times a day (QID) | ORAL | Status: DC | PRN

## 2013-07-27 NOTE — ED Notes (Signed)
Reports intermittent 3 day hx of Nausea, fever, headcold.

## 2013-07-27 NOTE — Discharge Instructions (Signed)
Robitussin with codeine as needed for cough.  Motrin 600 mg every 6 hours as needed for fever or pain.  Return to the ER if you develop difficulty breathing, bloody stool, or other new and concerning symptoms.   Viral Infections A viral infection can be caused by different types of viruses.Most viral infections are not serious and resolve on their own. However, some infections may cause severe symptoms and may lead to further complications. SYMPTOMS Viruses can frequently cause:  Minor sore throat.  Aches and pains.  Headaches.  Runny nose.  Different types of rashes.  Watery eyes.  Tiredness.  Cough.  Loss of appetite.  Gastrointestinal infections, resulting in nausea, vomiting, and diarrhea. These symptoms do not respond to antibiotics because the infection is not caused by bacteria. However, you might catch a bacterial infection following the viral infection. This is sometimes called a "superinfection." Symptoms of such a bacterial infection may include:  Worsening sore throat with pus and difficulty swallowing.  Swollen neck glands.  Chills and a high or persistent fever.  Severe headache.  Tenderness over the sinuses.  Persistent overall ill feeling (malaise), muscle aches, and tiredness (fatigue).  Persistent cough.  Yellow, green, or brown mucus production with coughing. HOME CARE INSTRUCTIONS   Only take over-the-counter or prescription medicines for pain, discomfort, diarrhea, or fever as directed by your caregiver.  Drink enough water and fluids to keep your urine clear or pale yellow. Sports drinks can provide valuable electrolytes, sugars, and hydration.  Get plenty of rest and maintain proper nutrition. Soups and broths with crackers or rice are fine. SEEK IMMEDIATE MEDICAL CARE IF:   You have severe headaches, shortness of breath, chest pain, neck pain, or an unusual rash.  You have uncontrolled vomiting, diarrhea, or you are unable to keep  down fluids.  You or your child has an oral temperature above 102 F (38.9 C), not controlled by medicine.  Your baby is older than 3 months with a rectal temperature of 102 F (38.9 C) or higher.  Your baby is 583 months old or younger with a rectal temperature of 100.4 F (38 C) or higher. MAKE SURE YOU:   Understand these instructions.  Will watch your condition.  Will get help right away if you are not doing well or get worse. Document Released: 01/07/2005 Document Revised: 06/22/2011 Document Reviewed: 08/04/2010 [facility] Patient Information 2014 [place]Care, [place].

## 2013-07-27 NOTE — ED Provider Notes (Signed)
CSN: [id number]     Arrival date & time 07/27/13  [id number] History   First MD Initiated Contact with Patient 07/27/13 1037     Chief Complaint  Patient presents with  . URI     (Consider location/radiation/quality/duration/timing/severity/associated sxs/prior Treatment) HPI Comments: Patient is a 23 year old male who presents with a three-day history of upper respiratory symptoms. Also reports some nausea and diarrhea. He has had low-grade fevers at home. He denies any ill contacts. He is able to tolerate liquids and is urinating normally. He has not had much appetite and has not taken in much solid food.  Patient is a 23 y.o. male presenting with URI. The history is provided by the patient.  URI Presenting symptoms: congestion and cough   Severity:  Moderate Onset quality:  Gradual Duration:  3 days Timing:  Constant Progression:  Worsening Chronicity:  New Relieved by:  Nothing Worsened by:  Nothing tried Ineffective treatments:  None tried   History reviewed. No pertinent past medical history. History reviewed. No pertinent past surgical history. History reviewed. No pertinent family history. History  Substance Use Topics  . Smoking status: Smoker, Current Status Unknown -- 0.50 packs/day    Types: Cigarettes  . Smokeless tobacco: Not on file  . Alcohol Use: Yes    Review of Systems  HENT: Positive for congestion.   Respiratory: Positive for cough.   All other systems reviewed and are negative.     Allergies  Shellfish allergy  Home Medications   Prior to Admission medications   Medication Sig Start Date End Date Taking? Authorizing Provider  ondansetron (ZOFRAN ODT) 4 MG disintegrating tablet Take 1 tablet (4 mg total) by mouth every 8 (eight) hours as needed for nausea or vomiting. 07/06/13   Teressa LowerVrinda Pickering, NP   BP 147/83  Pulse 92  Temp(Src) 98.3 F (36.8 C) (Oral)  Resp 18  Ht 6\' 3"  (1.905 m)  Wt 248 lb (112.492 kg)  BMI 31.00 kg/m2  SpO2  97% Physical Exam  Nursing note and vitals reviewed. Constitutional: He is oriented to person, place, and time. He appears well-developed and well-nourished. No distress.  Awake, alert, nontoxic appearance.  HENT:  Head: Normocephalic and atraumatic.  Mouth/Throat: Oropharynx is clear and moist.  Eyes: Right eye exhibits no discharge. Left eye exhibits no discharge.  Neck: Neck supple.  Pulmonary/Chest: Effort normal. He has no wheezes. He has no rales. He exhibits no tenderness.  Abdominal: Soft. There is no tenderness. There is no rebound.  Musculoskeletal: Normal range of motion. He exhibits no tenderness.  Baseline ROM, no obvious new focal weakness.  Neurological: He is alert and oriented to person, place, and time.  Mental status and motor strength appears baseline for patient and situation.  Skin: No rash noted. He is not diaphoretic.  Psychiatric: He has a normal mood and affect.    ED Course  Procedures (including critical care time) Labs Review Labs Reviewed - No data to display  Imaging Review No results found.   EKG Interpretation None      MDM   Final diagnoses:  None    Symptoms sound viral in nature. He appears well-hydrated and abdominal exam is benign. His lungs are clear and oxygen concentration is 97%. I've advised him this will likely take several more days to run its course. His worse symptom is his cough which has kept him up at night. I will prescribe Robitussin with codeine she can take for this. He understands to return if he  develops worsening or changing symptoms.    Geoffery Lyonsouglas Onofre Gains, MD 07/27/13 [phone]

## 2014-03-13 ENCOUNTER — Emergency Department (HOSPITAL_BASED_OUTPATIENT_CLINIC_OR_DEPARTMENT_OTHER)
Admission: EM | Admit: 2014-03-13 | Discharge: 2014-03-13 | Disposition: A | Discharge status: Home / Self Care | Payer: Self-pay | Attending: Emergency Medicine | Admitting: Emergency Medicine

## 2014-03-13 ENCOUNTER — Encounter (HOSPITAL_BASED_OUTPATIENT_CLINIC_OR_DEPARTMENT_OTHER): Payer: Self-pay | Admitting: *Deleted

## 2014-03-13 DIAGNOSIS — H9201 Otalgia, right ear: Secondary | ICD-10-CM | POA: Insufficient documentation

## 2014-03-13 DIAGNOSIS — Z79899 Other long term (current) drug therapy: Secondary | ICD-10-CM | POA: Insufficient documentation

## 2014-03-13 DIAGNOSIS — Z720 Tobacco use: Secondary | ICD-10-CM | POA: Insufficient documentation

## 2014-03-13 MED ORDER — AMOXICILLIN 500 MG PO CAPS
1000.0000 mg | ORAL_CAPSULE | Freq: Once | ORAL | Status: AC
  Administered 2014-03-13: 1000 mg via ORAL
  Filled 2014-03-13: qty 2

## 2014-03-13 MED ORDER — AMOXICILLIN 500 MG PO CAPS: 1000.0000 mg | ORAL_CAPSULE | Freq: Three times a day (TID) | ORAL | Status: DC

## 2014-03-13 MED ORDER — ANTIPYRINE-BENZOCAINE 5.4-1.4 % OT SOLN: 4.0000 [drp] | OTIC | Status: DC | PRN

## 2014-03-13 NOTE — ED Notes (Signed)
R ear pain, woke pt up, pain worse tonight, onset yesterday, has been using gtts to unclog ear wax, no meds for pain PTA, rates 8/10, (denies: dizziness, hearing changes, nv, injury, Q-tip use or fever)

## 2014-03-13 NOTE — ED Notes (Signed)
Alert, NAD, calm, interactive, steady gait.  

## 2014-03-13 NOTE — ED Provider Notes (Signed)
CSN: [id number]     Arrival date & time 03/13/14  0449 History   First MD Initiated Contact with Patient 03/13/14 0501     Chief Complaint  Patient presents with  . Ear Pain      (Consider location/radiation/quality/duration/timing/severity/associated sxs/prior Treatment) HPI  This is a 23 year old male who has been treating a perceived right cerumen impaction with an over-the-counter earwax removal kit. He has been doing this for the past 3 days. This morning he awoke from sleep with moderate to severe pain in his right ear. He also has a sensation that his voice is carrying into his right ear. He denies tinnitus. He denies vertigo. He denies drainage from the ear. He denies pain on movement of the right ear. He denies fever.  History reviewed. No pertinent past medical history. History reviewed. No pertinent past surgical history. No family history on file. History  Substance Use Topics  . Smoking status: Smoker, Current Status Unknown -- 0.50 packs/day    Types: Cigarettes  . Smokeless tobacco: Not on file  . Alcohol Use: Yes    Review of Systems  All other systems reviewed and are negative.   Allergies  Shellfish allergy  Home Medications   Prior to Admission medications   Medication Sig Start Date End Date Taking? Authorizing Provider  guaiFENesin-codeine 100-10 MG/5ML syrup Take 10 mLs by mouth every 6 (six) hours as needed for cough. 07/27/13   Veryl Speak, MD  ondansetron (ZOFRAN ODT) 4 MG disintegrating tablet Take 1 tablet (4 mg total) by mouth every 8 (eight) hours as needed for nausea or vomiting. 07/06/13   Glendell Docker, NP   BP 118/70 mmHg  Pulse 88  Temp(Src) 98.6 F (37 C) (Oral)  Resp 20  Ht 6' 3"  (1.905 m)  Wt 275 lb (124.739 kg)  BMI 34.37 kg/m2  SpO2 98%   Physical Exam  General: Well-developed, well-nourished male in no acute distress; appearance consistent with age of record HENT: normocephalic; atraumatic; left TM normal, left external  auditory canal normal; right TM erythematous and withdrawn, no perforation seen, no cerumen impaction, no pain on movement of right external ear Eyes: Normal appearance Neck: supple Heart: regular rate and rhythm Lungs: clear to auscultation bilaterally Abdomen: soft; nondistended Extremities: No deformity; full range of motion Neurologic: Awake, alert; motor function intact in all extremities and symmetric; no facial droop Skin: Warm and dry Psychiatric: Normal mood and affect    ED Course  Procedures (including critical care time)   MDM  Will treat for otitis media. Suspect this started with serous otitis media that patient mistook for cerumen impaction.   Wynetta Fines, MD 03/13/14 [phone]

## 2014-04-20 ENCOUNTER — Emergency Department (HOSPITAL_BASED_OUTPATIENT_CLINIC_OR_DEPARTMENT_OTHER)
Admission: EM | Admit: 2014-04-20 | Discharge: 2014-04-20 | Discharge status: Against Medical Advice | Payer: Self-pay | Attending: Emergency Medicine | Admitting: Emergency Medicine

## 2014-04-20 ENCOUNTER — Encounter (HOSPITAL_BASED_OUTPATIENT_CLINIC_OR_DEPARTMENT_OTHER): Payer: Self-pay

## 2014-04-20 DIAGNOSIS — R109 Unspecified abdominal pain: Secondary | ICD-10-CM | POA: Insufficient documentation

## 2014-04-20 DIAGNOSIS — Z720 Tobacco use: Secondary | ICD-10-CM | POA: Insufficient documentation

## 2014-04-20 DIAGNOSIS — R05 Cough: Secondary | ICD-10-CM | POA: Insufficient documentation

## 2014-04-20 DIAGNOSIS — J029 Acute pharyngitis, unspecified: Secondary | ICD-10-CM | POA: Insufficient documentation

## 2014-04-20 NOTE — ED Notes (Signed)
C/o fever, chills, cough,sorethroat, abd pain x 3 days

## 2014-04-20 NOTE — ED Notes (Signed)
Pt called x1, no answer 

## 2014-08-03 ENCOUNTER — Emergency Department (HOSPITAL_BASED_OUTPATIENT_CLINIC_OR_DEPARTMENT_OTHER)
Admission: EM | Admit: 2014-08-03 | Discharge: 2014-08-03 | Discharge status: Against Medical Advice | Payer: Self-pay | Attending: Emergency Medicine | Admitting: Emergency Medicine

## 2014-08-03 ENCOUNTER — Encounter (HOSPITAL_BASED_OUTPATIENT_CLINIC_OR_DEPARTMENT_OTHER): Payer: Self-pay | Admitting: Emergency Medicine

## 2014-08-03 DIAGNOSIS — N508 Other specified disorders of male genital organs: Secondary | ICD-10-CM | POA: Insufficient documentation

## 2014-08-03 DIAGNOSIS — Z720 Tobacco use: Secondary | ICD-10-CM | POA: Insufficient documentation

## 2014-08-03 NOTE — ED Notes (Signed)
Patient states that he has scrotal pain under his scrotum - x 2 -3 days

## 2014-11-15 ENCOUNTER — Emergency Department (HOSPITAL_COMMUNITY)
Admission: EM | Admit: 2014-11-15 | Discharge: 2014-11-15 | Disposition: A | Discharge status: Home / Self Care | Payer: Self-pay | Attending: Emergency Medicine | Admitting: Emergency Medicine

## 2014-11-15 ENCOUNTER — Encounter (HOSPITAL_COMMUNITY): Payer: Self-pay

## 2014-11-15 DIAGNOSIS — R51 Headache: Secondary | ICD-10-CM | POA: Insufficient documentation

## 2014-11-15 DIAGNOSIS — H66004 Acute suppurative otitis media without spontaneous rupture of ear drum, recurrent, right ear: Secondary | ICD-10-CM | POA: Insufficient documentation

## 2014-11-15 DIAGNOSIS — Z720 Tobacco use: Secondary | ICD-10-CM | POA: Insufficient documentation

## 2014-11-15 MED ORDER — HYDROCODONE-ACETAMINOPHEN 5-325 MG PO TABS: 1.0000 | ORAL_TABLET | ORAL | Status: DC | PRN

## 2014-11-15 MED ORDER — AMOXICILLIN 500 MG PO CAPS
1000.0000 mg | ORAL_CAPSULE | Freq: Once | ORAL | Status: AC
  Administered 2014-11-15: 1000 mg via ORAL
  Filled 2014-11-15: qty 2

## 2014-11-15 MED ORDER — AMOXICILLIN 500 MG PO CAPS: 1000.0000 mg | ORAL_CAPSULE | Freq: Two times a day (BID) | ORAL | Status: DC

## 2014-11-15 MED ORDER — HYDROCODONE-ACETAMINOPHEN 5-325 MG PO TABS
1.0000 | ORAL_TABLET | Freq: Once | ORAL | Status: AC
  Administered 2014-11-15: 1 via ORAL
  Filled 2014-11-15: qty 1

## 2014-11-15 MED ORDER — IBUPROFEN 800 MG PO TABS: 800.0000 mg | ORAL_TABLET | Freq: Three times a day (TID) | ORAL | Status: DC

## 2014-11-15 NOTE — ED Notes (Signed)
Pain in rt ear coming from a headache x 2 days. Denies dizziness, runny nose, discharge from ear. Pain is getting progressively worse.

## 2014-11-15 NOTE — Discharge Instructions (Signed)

## 2014-11-15 NOTE — ED Provider Notes (Signed)
CSN: [id number]     Arrival date & time 11/15/14  0138 History   First MD Initiated Contact with Patient 11/15/14 0208     Chief Complaint  Patient presents with  . Otalgia     (Consider location/radiation/quality/duration/timing/severity/associated sxs/prior Treatment) Patient is a 24 y.o. male presenting with ear pain. The history is provided by the patient. No language interpreter was used.  Otalgia Location:  Right Quality:  Aching and sharp Severity:  Moderate Onset quality:  Gradual Duration:  2 days Chronicity:  Recurrent Associated symptoms: headaches   Associated symptoms: no congestion, no cough, no fever and no vomiting   Associated symptoms comment:  Right ear pain for 2 days without injury. No bleeding or drainage from the ear canal. No sore throat, congestion or known fever. He reports having similar symptoms in the past with ear infections. No cough, nausea or hearing changes.    History reviewed. No pertinent past medical history. History reviewed. No pertinent past surgical history. No family history on file. History  Substance Use Topics  . Smoking status: Current Every Day Smoker -- 0.50 packs/day    Types: Cigarettes  . Smokeless tobacco: Not on file  . Alcohol Use: Yes    Review of Systems  Constitutional: Negative for fever.  HENT: Positive for ear pain. Negative for congestion and trouble swallowing.   Respiratory: Negative for cough.   Gastrointestinal: Negative for nausea and vomiting.  Neurological: Positive for headaches. Negative for dizziness.      Allergies  Shellfish allergy  Home Medications   Prior to Admission medications   Not on File   BP 129/75 mmHg  Pulse 76  Temp(Src) 98.3 F (36.8 C) (Oral)  Resp 20  Ht  (1.854 m)  Wt 284 lb (128.822 kg)  BMI 37.48 kg/m2  SpO2 99% Physical Exam  Constitutional: He is oriented to person, place, and time. He appears well-developed and well-nourished.  HENT:  Right Ear: Ear canal  normal.  Left Ear: Tympanic membrane and ear canal normal.  Nose: Mucosal edema present.  Mouth/Throat: Uvula is midline and oropharynx is clear and moist. Mucous membranes are not dry.  Right TM is bulging with middle ear effusion.   Neck: Normal range of motion.  Pulmonary/Chest: Effort normal.  Musculoskeletal: Normal range of motion.  Neurological: He is alert and oriented to person, place, and time.  Skin: Skin is warm and dry.  Psychiatric: He has a normal mood and affect.    ED Course  Procedures (including critical care time) Labs Review Labs Reviewed - No data to display  Imaging Review No results found.   EKG Interpretation None      MDM   Final diagnoses:  None    1. Right otitis with effusion  Will provide resources for PCP follow up as the patient does not have a primary care doctor. Will start on Amoxil and provide pain relief.     Elpidio Anis, PA-C 11/15/14 4098  Marisa Severin, MD 11/15/14 0530

## 2014-11-15 NOTE — ED Notes (Signed)
Patient reports right ear pain x 2 days, worse over the past few hours.

## 2014-11-15 NOTE — ED Notes (Signed)
Bed: WA08 Expected date:  Expected time:  Means of arrival:  Comments: 

## 2015-01-13 ENCOUNTER — Encounter (HOSPITAL_COMMUNITY): Payer: Self-pay | Admitting: *Deleted

## 2015-01-13 ENCOUNTER — Emergency Department (HOSPITAL_COMMUNITY): Payer: Self-pay

## 2015-01-13 ENCOUNTER — Emergency Department (HOSPITAL_COMMUNITY)
Admission: EM | Admit: 2015-01-13 | Discharge: 2015-01-13 | Disposition: A | Discharge status: Home / Self Care | Payer: Self-pay | Attending: Emergency Medicine | Admitting: Emergency Medicine

## 2015-01-13 DIAGNOSIS — R0600 Dyspnea, unspecified: Secondary | ICD-10-CM

## 2015-01-13 DIAGNOSIS — Z791 Long term (current) use of non-steroidal anti-inflammatories (NSAID): Secondary | ICD-10-CM | POA: Insufficient documentation

## 2015-01-13 DIAGNOSIS — Z792 Long term (current) use of antibiotics: Secondary | ICD-10-CM | POA: Insufficient documentation

## 2015-01-13 DIAGNOSIS — J439 Emphysema, unspecified: Secondary | ICD-10-CM | POA: Insufficient documentation

## 2015-01-13 DIAGNOSIS — Z720 Tobacco use: Secondary | ICD-10-CM | POA: Insufficient documentation

## 2015-01-13 LAB — BASIC METABOLIC PANEL
Anion gap: 4 — ABNORMAL LOW (ref 5–15)
BUN: 12 mg/dL (ref 6–20)
CALCIUM: 8.5 mg/dL — AB (ref 8.9–10.3)
CO2: 22 mmol/L (ref 22–32)
CREATININE: 1 mg/dL (ref 0.61–1.24)
Chloride: 109 mmol/L (ref 101–111)
Glucose, Bld: 119 mg/dL — ABNORMAL HIGH (ref 65–99)
Potassium: 3.6 mmol/L (ref 3.5–5.1)
SODIUM: 135 mmol/L (ref 135–145)

## 2015-01-13 LAB — CBC WITH DIFFERENTIAL/PLATELET
BASOS PCT: 0 %
Basophils Absolute: 0 10*3/uL (ref 0.0–0.1)
EOS PCT: 2 %
Eosinophils Absolute: 0.1 10*3/uL (ref 0.0–0.7)
HCT: 45.5 % (ref 39.0–52.0)
HEMOGLOBIN: 15.3 g/dL (ref 13.0–17.0)
Lymphocytes Relative: 29 %
Lymphs Abs: 2.7 10*3/uL (ref 0.7–4.0)
MCH: 27.2 pg (ref 26.0–34.0)
MCHC: 33.6 g/dL (ref 30.0–36.0)
MCV: 80.8 fL (ref 78.0–100.0)
Monocytes Absolute: 0.6 10*3/uL (ref 0.1–1.0)
Monocytes Relative: 6 %
NEUTROS PCT: 63 %
Neutro Abs: 5.9 10*3/uL (ref 1.7–7.7)
Platelets: 232 10*3/uL (ref 150–400)
RBC: 5.63 MIL/uL (ref 4.22–5.81)
RDW: 14.3 % (ref 11.5–15.5)
WBC: 9.2 10*3/uL (ref 4.0–10.5)

## 2015-01-13 MED ORDER — ALBUTEROL SULFATE HFA 108 (90 BASE) MCG/ACT IN AERS
2.0000 | INHALATION_SPRAY | Freq: Once | RESPIRATORY_TRACT | Status: AC
  Administered 2015-01-13: 2 via RESPIRATORY_TRACT
  Filled 2015-01-13: qty 6.7

## 2015-01-13 MED ORDER — IPRATROPIUM-ALBUTEROL 0.5-2.5 (3) MG/3ML IN SOLN
3.0000 mL | Freq: Once | RESPIRATORY_TRACT | Status: AC
  Administered 2015-01-13: 3 mL via RESPIRATORY_TRACT
  Filled 2015-01-13: qty 3

## 2015-01-13 NOTE — ED Notes (Signed)
Pt reports SOB x2 days, nasal discharge x1 day. Reports intermittent "chest tightness" x2 days. Denies cough, sore throat, facial or ear pain, denies any pain. Pt able to speak in full sentences. No hx of blood clots. Current cigarette smoker. 100% on room air.

## 2015-01-13 NOTE — ED Notes (Signed)
Awake. Verbally responsive. A/O x4. Resp even and unlabored. No audible adventitious breath sounds noted. ABC's intact.  

## 2015-01-13 NOTE — ED Notes (Signed)
Pt reported SHOB and dyspnea at rest and with exertion x2 days. Denies coughing. Nasal congestion with clear drainage.

## 2015-01-13 NOTE — ED Provider Notes (Signed)
CSN: [id number]     Arrival date & time 01/13/15  4098 History   First MD Initiated Contact with Patient 01/13/15 0754     Chief Complaint  Patient presents with  . Shortness of Breath     (Consider location/radiation/quality/duration/timing/severity/associated sxs/prior Treatment) The history is provided by the patient.     Pt presents with SOB x 2 days.  SOB has been intermittent but mostly present, worse with certain positions and at night.  Has occasional tightness in his chest.  Rhinorrhea last night only.   The SOB is not exertional and not pleuritic.  He does smoke cigarettes and marijuana.  Denies any chemical or other inhalation exposures.   Denies fevers, sore throat, cough, leg swelling, any abnormal bleeding.  Denies recent illness, recent travel or immobilization.  No personal or family hx blood clots.   Has had sick contact in significant other who has had sinusitis and cough.  Donates plasma twice weekly.  Does not see a regular PCP but has no known medical problems.    History reviewed. No pertinent past medical history. History reviewed. No pertinent past surgical history. History reviewed. No pertinent family history. Social History  Substance Use Topics  . Smoking status: Current Every Day Smoker -- 0.50 packs/day    Types: Cigarettes  . Smokeless tobacco: None  . Alcohol Use: Yes    Review of Systems  All other systems reviewed and are negative.     Allergies  Shellfish allergy  Home Medications   Prior to Admission medications   Medication Sig Start Date End Date Taking? Authorizing Provider  amoxicillin (AMOXIL) 500 MG capsule Take 2 capsules (1,000 mg total) by mouth 2 (two) times daily. 11/15/14   Elpidio Anis, PA-C  HYDROcodone-acetaminophen (NORCO/VICODIN) 5-325 MG per tablet Take 1-2 tablets by mouth every 4 (four) hours as needed. 11/15/14   Elpidio Anis, PA-C  ibuprofen (ADVIL,MOTRIN) 800 MG tablet Take 1 tablet (800 mg total) by mouth 3 (three)  times daily. 11/15/14   Shari Upstill, PA-C   BP 115/61 mmHg  Pulse 74  Temp(Src) 97.9 F (36.6 C) (Oral)  Resp 16  Wt 289 lb (131.09 kg)  SpO2 100% Physical Exam  Constitutional: He appears well-developed and well-nourished. No distress.  HENT:  Head: Normocephalic and atraumatic.  Neck: Neck supple.  Cardiovascular: Normal rate and regular rhythm.   Pulmonary/Chest: Effort normal and breath sounds normal. No accessory muscle usage. Tachypnea noted. No respiratory distress. He has no decreased breath sounds. He has no wheezes. He has no rhonchi. He has no rales.  Abdominal: Soft. He exhibits no distension and no mass. There is no tenderness. There is no rebound and no guarding.  Musculoskeletal: He exhibits no edema.  Neurological: He is alert. He exhibits normal muscle tone.  Skin: He is not diaphoretic.  Nursing note and vitals reviewed.   ED Course  Procedures (including critical care time) Labs Review Labs Reviewed  BASIC METABOLIC PANEL - Abnormal; Notable for the following:    Glucose, Bld 119 (*)    Calcium 8.5 (*)    Anion gap 4 (*)    All other components within normal limits  CBC WITH DIFFERENTIAL/PLATELET    Imaging Review Dg Chest 2 View  01/13/2015   CLINICAL DATA:  Two day history of shortness of breath  EXAM: CHEST  2 VIEW  COMPARISON:  None.  FINDINGS: There are tiny bullae in the apices bilaterally. Lungs elsewhere clear. Heart size and pulmonary vascularity are normal. No  adenopathy. No pneumothorax. No bone lesions.  IMPRESSION: Tiny apical bullae bilaterally.  No edema or consolidation.   Electronically Signed   By: Bretta Bang III M.D.   On: 01/13/2015 08:37   I have personally reviewed and evaluated these images and lab results as part of my medical decision-making.   EKG Interpretation None       Wells' Criteria for PE score is zero.  PERC negative.   9:38 AM Discussed pt and discharge plan and reviewed xray with Dr Adela Lank.     Pt feeling  complete relief after neb treatment.  Lungs CTAB on reexamination.    MDM   Final diagnoses:  Dyspnea  Lung bullae (HCC)    Afebrile, nontoxic patient with dyspnea x 2 days.  Lungs CTAB.  CXR shows tiny bullae in bilateral apices.  Nearly complete relief with neb treatment.  Pt dropped O2 sat into upper 80s when falling asleep.  Notes he does snore.  I have advised that he see PCP and discuss getting a sleep study.  I also advised that he stop smoking.  Discussed return precautions, including for pneumothorax. Pt given albuterol inhaler for home.   D/C home with PCP follow up - resources provided and [facility].  Discussed result, findings, treatment, and follow up  with patient.  Pt given return precautions.  Pt verbalizes understanding and agrees with plan.         Trixie Dredge, PA-C 01/13/15 1037  Melene Plan, DO 01/13/15 1430

## 2015-01-13 NOTE — Discharge Instructions (Signed)
Read the information below.  You may return to the Emergency Department at any time for worsening condition or any new symptoms that concern you.  If you develop chest pain, increased shortness of breath, fever, you pass out, or become weak or dizzy, return to the ER for a recheck.     Please make an appointment with a primary care provider for further evaluation.  You may also need a sleep study to evaluate for sleep apnea.  Shortness of Breath Shortness of breath means you have trouble breathing. It could also mean that you have a medical problem. You should get immediate medical care for shortness of breath. CAUSES   Not enough oxygen in the air such as with high altitudes or a smoke-filled room.  Certain lung diseases, infections, or problems.  Heart disease or conditions, such as angina or heart failure.  Low red blood cells (anemia).  Poor physical fitness, which can cause shortness of breath when you exercise.  Chest or back injuries or stiffness.  Being overweight.  Smoking.  Anxiety, which can make you feel like you are not getting enough air. DIAGNOSIS  Serious medical problems can often be found during your physical exam. Tests may also be done to determine why you are having shortness of breath. Tests may include:  Chest X-rays.  Lung function tests.  Blood tests.  An electrocardiogram (ECG).  An ambulatory electrocardiogram. An ambulatory ECG records your heartbeat patterns over a 24-hour period.  Exercise testing.  A transthoracic echocardiogram (TTE). During echocardiography, sound waves are used to evaluate how blood flows through your heart.  A transesophageal echocardiogram (TEE).  Imaging scans. Your health care provider may not be able to find a cause for your shortness of breath after your exam. In this case, it is important to have a follow-up exam with your health care provider as directed.  TREATMENT  Treatment for shortness of breath depends on  the cause of your symptoms and can vary greatly. HOME CARE INSTRUCTIONS   Do not smoke. Smoking is a common cause of shortness of breath. If you smoke, ask for help to quit.  Avoid being around chemicals or things that may bother your breathing, such as paint fumes and dust.  Rest as needed. Slowly resume your usual activities.  If medicines were prescribed, take them as directed for the full length of time directed. This includes oxygen and any inhaled medicines.  Keep all follow-up appointments as directed by your health care provider. SEEK MEDICAL CARE IF:   Your condition does not improve in the time expected.  You have a hard time doing your normal activities even with rest.  You have any new symptoms. SEEK IMMEDIATE MEDICAL CARE IF:   Your shortness of breath gets worse.  You feel light-headed, faint, or develop a cough not controlled with medicines.  You start coughing up blood.  You have pain with breathing.  You have chest pain or pain in your arms, shoulders, or abdomen.  You have a fever.  You are unable to walk up stairs or exercise the way you normally do. MAKE SURE YOU:  Understand these instructions.  Will watch your condition.  Will get help right away if you are not doing well or get worse. Document Released: 12/23/2000 Document Revised: 04/04/2013 Document Reviewed: 06/15/2011 [facility] Patient Information 2015 [place], [place]. This information is not intended to replace advice given to you by your health care provider. Make sure you discuss any questions you have with your  health care provider.    Emergency Department Resource Guide 1) Find a Doctor and Pay Out of Pocket Although you won't have to find out who is covered by your insurance plan, it is a good idea to ask around and get recommendations. You will then need to call the office and see if the doctor you have chosen will accept you as a new patient and what types of options they offer for  patients who are self-pay. Some doctors offer discounts or will set up payment plans for their patients who do not have insurance, but you will need to ask so you aren't surprised when you get to your appointment.  2) Contact Your Local Health Department Not all health departments have doctors that can see patients for sick visits, but many do, so it is worth a call to see if yours does. If you don't know where your local health department is, you can check in your phone book. The CDC also has a tool to help you locate your state's health department, and many state websites also have listings of all of their local health departments.  3) Find a Walk-in Clinic If your illness is not likely to be very severe or complicated, you may want to try a walk in clinic. These are popping up all over the country in pharmacies, drugstores, and shopping centers. They're usually staffed by nurse practitioners or physician assistants that have been trained to treat common illnesses and complaints. They're usually fairly quick and inexpensive. However, if you have serious medical issues or chronic medical problems, these are probably not your best option.  No Primary Care Doctor: - Call Health Connect at  [phone] - they can help you locate a primary care doctor that  accepts your insurance, provides certain services, etc. - Physician Referral Service- [phone]  Chronic Pain Problems: Organization         Address  Phone   Notes  [facility]  [phone] Patients need to be referred by their primary care doctor.   Medication Assistance: Organization         Address  Phone   Notes  [facility] Medication [facility] [address] [place] [place], [place] [address] [phone] --Must be a resident of [facility] -- Must have NO insurance coverage whatsoever (no Medicaid/ Medicare, etc.) -- The pt. MUST have a primary care doctor that directs their care regularly  and follows them in the community   [facility]ist  [phone]   [employer]  [phone]    Agencies that provide inexpensive medical care: Organization         Address  Phone   Notes  [facility]  [phone]   [facility]    [phone]   [facility] [address] [place], [place] [address] [phone]   [facility] [address] [place], [place] [phone]   Planned Parenthood    [phone]   [facility]    [phone]   Community Health and [facility]  [address], [place] Phone:  [phone], Fax:  [phone] Hours of Operation:  9 am - 6 pm, M-F.  Also accepts Medicaid/Medicare and self-pay.  [facility] for Children  [address], [place] Phone: [phone], Fax: [phone]. Hours of Operation:  8:30 am - 5:30 pm, M-F.  Also accepts Medicaid  and self-pay.  [facility] [place] [address] [place] Phone: [phone]   [facility] [address] Natasha Bence [place], [place] [phone], Ext. [phone] Mondays & Thursdays: 7-9 AM.  First 15 patients are seen on a first come, first serve basis.    Medicaid-accepting [facility] Providers:  Organization         Address  Phone   Notes  [facility] [address], [place] [phone] Also accepts self-pay patients.  [facility] [address] Laurell Josephs [place], [place]  [phone]   [facility] [address], [place] [phone]   [facility] [address], [place] [phone]   Renaye Rakers [address], [place]   [phone] Only accepts [place] Access [place] patients after they have their name applied to their card.   Self-Pay (no insurance) in [facility]:  Organization         Address  Phone   Notes  Sickle  Cell Patients, [facility] Internal Medicine [address] [place], [place] [phone]   [facility] [address] [place], [place] [phone]   [facility] [place]  [address], [place] [phone]   [facility]/Dr. Osei-Bonsu  [address] [place] or [address], [place] [phone] Phone number for both [place] and [place] locations is the same.  [facility] [employer] [address] [place] [phone]   [facility] [address], [place] or [address] [phone]   [facility] [address] [place] [phone], phone; [phone], fax Sees patients 1st and 3rd Saturday of every month.  Must not qualify for public or private insurance (i.e. Medicaid, Medicare, [place] Health Choice, Veterans' Benefits)  Household income should be no more than 200% of the poverty level The clinic cannot treat you if you are pregnant or think you are pregnant  Sexually transmitted diseases are not treated at the clinic.    Dental Care: Organization         Address  Phone  Notes  [facility] Department of [facility] [address] [place], [place] [phone] Accepts children up to age 7 who are enrolled in [place] or [place] Health Choice; pregnant women with a Medicaid card; and children who have applied for Medicaid or [place] Health Choice, but were declined, whose parents can pay a reduced fee at time of service.  [facility] Department of [facility]  [address], Ashvin Adelson [place] [phone] Accepts children up to age 79 who are enrolled in [place] or [place] Health Choice; pregnant women with a Medicaid card; and children who have applied for Medicaid or [place] Health Choice, but were declined, whose parents can pay a reduced fee at time of service.  [facility]  [address] [place], [place] [phone] Patients are seen by appointment only. Walk-ins are not accepted. [facility] will see patients 53 years of age and older. Monday - Tuesday (8am-5pm) Most Wednesdays (8:30-5pm) $30 per visit, cash only  [facility] Access PRO[facility]  [address], [facility] [phone] Patients are seen by appointment only. Walk-ins are not accepted. [facility] will see patients  20 years of age and older. One Wednesday Evening (Monthly: Volunteer Based).  $30 per visit, cash only  [employer]  [phone] for adults; Children under age 81, call Graduate Pediatric Dentistry at [phone]. Children aged 60-14, please call [phone] to request a pediatric application.  Dental services are provided in all areas of dental care including fillings, crowns and bridges, complete and partial dentures, implants, gum treatment, root canals, and extractions. Preventive care is also provided. Treatment is provided to both adults and children. Patients are selected via a lottery and there is often a waiting list.   [facility] [address], [place]  [phone] [email or web address]   [facility] [address] [place], [place] [phone], Ext. [phone] Second and Fourth Thursday of each month, opens at 6:30 AM; Clinic ends at 9 AM.  Patients are seen on a first-come first-served basis, and a limited number are seen during each clinic.   [facility]  [address] Ether Griffins [place], [place] [phone]   Eligibility Requirements You must have lived in [place], [place], or [place] counties for at least the last three months.   You cannot be eligible for state or federal sponsored [place], including [employer], [place], or [employer].   You generally cannot be eligible for healthcare insurance through your employer.    How to apply: Eligibility  screenings are held every Tuesday and Wednesday afternoon from 1:00 pm until 4:00 pm. You do not need an appointment for the interview!  [facility] [address] [place], [place] [phone]   [facility] Health Department  [phone]   [facility]  [phone]   [facility] Health Department  [phone]    Behavioral Health Resources in the Community: Intensive Outpatient Programs Organization         Address  Phone  Notes  [facility] [address] [place], [place] [phone]   [facility] [address] [place], [place] [phone]   [facility] [address], [place], [place]  [phone]   [facility] Mental Health [address]  [place], [place] [phone] or [phone]   Substance Abuse Resources Organization         Address  Phone  Notes  [facility] Drug Services  [phone]   [facility]  [phone]   [facility] [place]  6041986154   Floydene Flock  [phone]   Residential & Outpatient Substance Abuse Program  [phone]   Psychological Services Organization         Address  Phone  Notes  [facility]  [phone]   [facility]  [phone]   [facility] Mental Health [address], [place] [phone] or [phone]    Mobile Crisis Teams Organization         Address  Phone  Notes  [facility], Mobile Crisis Care Unit  [phone]   [facility]  [address] [place], [place] [phone]   Doristine Locks [address] [place] [phone]    Self-Help/Support Groups Organization         Address  Phone             Notes  [facility] - variety of support groups  336- I7437963 Call for more information  Narcotics Anonymous (NA), [facility] [address] [place]  2 meetings at  this location   Residential Treatment Programs Organization         Address  Phone  Notes  [facility] [address],    [place]  [phone]   [facility]  [address] [place] [address], [place], [place] [phone]   [facility] [address] [place], [place] [phone] Admissions: 8am-3pm M-F  [facility] [address],    [place], [place] [phone]   [facility] [address] [place], [place], [place] [phone]   [facility] [address],  [place], [place] [phone]   [facility] - Intensive Outpatient [address] Laurell Josephs [address], [place], [place] [phone]   [facility] ([facility] [address] [place]  [place], [place] [phone] or [phone]   [facility] ([facility]) [address], [place], [place] [phone] Accepts Medicaid  [facility] [address],  [place] [phone] Substance Abuse/Addiction Treatment   [facility] Organization         Address  Phone  Notes  [facility]  [phone]   Angie Fava, PhD [address] Ervin Knack [place], [place]   4087239932 or [phone]   [facility] Behavioral   [address] [place], [place] [phone]   [facility] [address] [place], [place] [phone] Insurance/Medicaid/sponsorship through [facility] and Families [address]                                    [place], [place] [phone] Therapy/tele-psych/case  [facility] [address] [place] [address], [place] [phone]    Dr. Lolly Mustache  [phone]   [facility]  [employer] [facility] Dept. 1) [address], [place] 2) [address], [place] 3)  [address], [place] [phone]  [phone]   [facility] Child Abuse Hotline (743) 386-8031 or [phone] (After Hours)

## 2015-01-13 NOTE — ED Notes (Signed)
PA at bedside Pt alert and oriented x4. Respirations even and unlabored, bilateral symmetrical rise and fall of chest. Skin warm and dry. In no acute distress. Denies needs.   

## 2015-01-13 NOTE — ED Notes (Signed)
Awake. Verbally responsive. A/O x4. Resp even and unlabored. No audible adventitious breath sounds noted. ABC's intact. SR on monitor. Family at bedside. 

## 2015-01-13 NOTE — ED Notes (Signed)
Pt to x-ray and returned to room without distress noted. 

## 2015-01-16 ENCOUNTER — Emergency Department (HOSPITAL_COMMUNITY): Payer: Self-pay

## 2015-01-16 ENCOUNTER — Encounter (HOSPITAL_COMMUNITY): Payer: Self-pay

## 2015-01-16 ENCOUNTER — Emergency Department (HOSPITAL_COMMUNITY)
Admission: EM | Admit: 2015-01-16 | Discharge: 2015-01-16 | Disposition: A | Discharge status: Home / Self Care | Payer: Self-pay | Attending: Emergency Medicine | Admitting: Emergency Medicine

## 2015-01-16 DIAGNOSIS — R0602 Shortness of breath: Secondary | ICD-10-CM | POA: Insufficient documentation

## 2015-01-16 DIAGNOSIS — M546 Pain in thoracic spine: Secondary | ICD-10-CM | POA: Insufficient documentation

## 2015-01-16 DIAGNOSIS — Z79899 Other long term (current) drug therapy: Secondary | ICD-10-CM | POA: Insufficient documentation

## 2015-01-16 DIAGNOSIS — Z87891 Personal history of nicotine dependence: Secondary | ICD-10-CM | POA: Insufficient documentation

## 2015-01-16 MED ORDER — OXYCODONE-ACETAMINOPHEN 5-325 MG PO TABS
2.0000 | ORAL_TABLET | Freq: Once | ORAL | Status: AC
  Administered 2015-01-16: 2 via ORAL
  Filled 2015-01-16: qty 2

## 2015-01-16 MED ORDER — TRAMADOL HCL 50 MG PO TABS: 50.0000 mg | ORAL_TABLET | Freq: Four times a day (QID) | ORAL | Status: DC | PRN

## 2015-01-16 MED ORDER — KETOROLAC TROMETHAMINE 60 MG/2ML IM SOLN
60.0000 mg | Freq: Once | INTRAMUSCULAR | Status: AC
  Administered 2015-01-16: 60 mg via INTRAMUSCULAR
  Filled 2015-01-16: qty 2

## 2015-01-16 MED ORDER — IBUPROFEN 800 MG PO TABS: 800.0000 mg | ORAL_TABLET | Freq: Three times a day (TID) | ORAL | Status: DC

## 2015-01-16 NOTE — ED Provider Notes (Signed)
CSN: [id number]     Arrival date & time 01/16/15  1454 History   First MD Initiated Contact with Patient 01/16/15 1524     Chief Complaint  Patient presents with  . Shortness of Breath  . Back Pain     (Consider location/radiation/quality/duration/timing/severity/associated sxs/prior Treatment) Patient is a 24 y.o. male presenting with back pain.  Back Pain Pain location: right lateral mid-thoracic area. Quality:  Aching and stabbing Pain severity:  Mild Onset quality:  Gradual Timing:  Intermittent Chronicity:  New Context: not emotional stress, not MCA and not MVA   Relieved by:  None tried Worsened by:  Nothing tried Ineffective treatments:  None tried Associated symptoms: no abdominal pain, no chest pain, no fever and no headaches     History reviewed. No pertinent past medical history. History reviewed. No pertinent past surgical history. History reviewed. No pertinent family history. Social History  Substance Use Topics  . Smoking status: Former Smoker -- 0.00 packs/day    Quit date: 01/13/2015  . Smokeless tobacco: None  . Alcohol Use: Yes    Review of Systems  Constitutional: Negative for fever and chills.  Eyes: Negative for pain.  Respiratory: Positive for cough. Negative for shortness of breath.   Cardiovascular: Negative for chest pain and palpitations.  Gastrointestinal: Negative for abdominal pain.  Endocrine: Negative for polydipsia and polyuria.  Musculoskeletal: Positive for back pain.  Neurological: Negative for headaches.  All other systems reviewed and are negative.     Allergies  Shellfish allergy  Home Medications   Prior to Admission medications   Medication Sig Start Date End Date Taking? Authorizing Provider  albuterol (PROVENTIL HFA) 108 (90 BASE) MCG/ACT inhaler Inhale 2 puffs into the lungs every 4 (four) hours as needed for wheezing or shortness of breath.   Yes Historical Provider, MD  ibuprofen (ADVIL,MOTRIN) 800 MG tablet Take  1 tablet (800 mg total) by mouth 3 (three) times daily. 01/16/15   Marily Memos, MD  traMADol (ULTRAM) 50 MG tablet Take 1 tablet (50 mg total) by mouth every 6 (six) hours as needed. 01/16/15   Marily Memos, MD   BP 118/72 mmHg  Pulse 85  Temp(Src) 97.4 F (36.3 C) (Oral)  Resp 29  SpO2 100% Physical Exam  Constitutional: He is oriented to person, place, and time. He appears well-developed and well-nourished.  HENT:  Head: Normocephalic and atraumatic.  Eyes: Conjunctivae and EOM are normal.  Neck: Normal range of motion. Neck supple.  Cardiovascular: Normal rate and regular rhythm.   Pulmonary/Chest: Effort normal. No respiratory distress.  Abdominal: Soft. There is no tenderness.  Musculoskeletal: Normal range of motion. He exhibits no edema or tenderness.  Neurological: He is alert and oriented to person, place, and time.  Skin: Skin is warm and dry.  Nursing note and vitals reviewed.   ED Course  Procedures (including critical care time) Labs Review Labs Reviewed - No data to display  Imaging Review Dg Chest 2 View  01/16/2015   CLINICAL DATA:  Shortness of breath, cough and some RIGHT upper back pain since Sunday, smoker  EXAM: CHEST  2 VIEW  COMPARISON:  01/13/2015  FINDINGS: Upper-normal size of cardiac silhouette.  Mediastinal contours and pulmonary vascularity normal.  Minimal apical bullous disease.  Lungs clear.  No pulmonary infiltrate, pleural effusion or pneumothorax.  Osseous structures unremarkable.  IMPRESSION: No acute abnormalities.   Electronically Signed   By: Ulyses Southward M.D.   On: 01/16/2015 15:47   I have personally reviewed  and evaluated these images and lab results as part of my medical decision-making.   EKG Interpretation   Date/Time:  Wednesday January 16 2015 15:02:51 EDT Ventricular Rate:  87 PR Interval:  139 QRS Duration: 103 QT Interval:  376 QTC Calculation: 452 R Axis:   68 Text Interpretation:  Sinus rhythm T wave inversions in Lead III  only  Confirmed by Curtina Grills MD, Barbara Cower [phone]) on 01/16/2015 3:34:53 PM      MDM   Final diagnoses:  Right-sided thoracic back pain   24 yo M w/ bullae on lungs a couple days ago. Was told if he had pain to return. He had an episode of pain this morning in his right thoracic lateral back area. Slowly resolved prior to coming here. No vertigo component, exertional component. No rash. On exam there is evidence of rash, no tenderness to palpation, normal pulmonary findings. No cardiac findings. We'll get x-ray to ensure no pneumothorax. Otherwise will treat conservatively. When he to follow-up with pulmonology for repeat chest x-ray to ensure bulla have improved and if not to get a workup for them.  Patient symptoms improved. CXR similar to yesterday. Unsure of cause of symptoms. Will dc wit symptomatic treatment.   I have personally and contemperaneously reviewed labs and imaging and used in my decision making as above.   A medical screening exam was performed and I feel the patient has had an appropriate workup for their chief complaint at this time and likelihood of emergent condition existing is low. They have been counseled on decision, discharge, follow up and which symptoms necessitate immediate return to the emergency department. They or their family verbally stated understanding and agreement with plan and discharged in stable condition.      Marily Memos, MD 01/16/15 [phone]

## 2015-01-16 NOTE — Progress Notes (Signed)
This pt has an appt at [facility] to establish care on 01/24/15 at 0930  Entered in ED d/c f/u section

## 2015-01-16 NOTE — ED Notes (Addendum)
Pt c/o intermittent SOB and intermittent R mid back pain starting this morning.  Pain score 7/10.  Pt has not taken anything for pain.  Pt reports being seen at [facility] x 3 days ago for same and was told he "has blisters on his lung or in his chest."  NAD noted.  Pt easily speaking full sentences.

## 2015-01-16 NOTE — ED Notes (Signed)
Pt stated "I came because of this pain in my side that comes and goes.  I'm not having pain right now."  Pt is unable to reproduce the pain.

## 2015-01-19 ENCOUNTER — Encounter (HOSPITAL_COMMUNITY): Payer: Self-pay | Admitting: Emergency Medicine

## 2015-01-19 ENCOUNTER — Emergency Department (HOSPITAL_COMMUNITY): Payer: Self-pay

## 2015-01-19 ENCOUNTER — Emergency Department (HOSPITAL_COMMUNITY)
Admission: EM | Admit: 2015-01-19 | Discharge: 2015-01-20 | Disposition: A | Discharge status: Home / Self Care | Payer: Self-pay | Attending: Emergency Medicine | Admitting: Emergency Medicine

## 2015-01-19 DIAGNOSIS — Z79899 Other long term (current) drug therapy: Secondary | ICD-10-CM | POA: Insufficient documentation

## 2015-01-19 DIAGNOSIS — R0789 Other chest pain: Secondary | ICD-10-CM | POA: Insufficient documentation

## 2015-01-19 DIAGNOSIS — Z87891 Personal history of nicotine dependence: Secondary | ICD-10-CM | POA: Insufficient documentation

## 2015-01-19 DIAGNOSIS — R0602 Shortness of breath: Secondary | ICD-10-CM | POA: Insufficient documentation

## 2015-01-19 LAB — BASIC METABOLIC PANEL
ANION GAP: 9 (ref 5–15)
BUN: 8 mg/dL (ref 6–20)
CALCIUM: 8.7 mg/dL — AB (ref 8.9–10.3)
CO2: 21 mmol/L — ABNORMAL LOW (ref 22–32)
Chloride: 105 mmol/L (ref 101–111)
Creatinine, Ser: 0.96 mg/dL (ref 0.61–1.24)
GFR calc Af Amer: >60 mL/min (ref >60)
GLUCOSE: 104 mg/dL — AB (ref 65–99)
Potassium: 3.5 mmol/L (ref 3.5–5.1)
Sodium: 135 mmol/L (ref 135–145)

## 2015-01-19 LAB — CBC
HCT: 43.5 % (ref 39.0–52.0)
HEMOGLOBIN: 14.8 g/dL (ref 13.0–17.0)
MCH: 27.3 pg (ref 26.0–34.0)
MCHC: 34 g/dL (ref 30.0–36.0)
MCV: 80.1 fL (ref 78.0–100.0)
Platelets: 246 10*3/uL (ref 150–400)
RBC: 5.43 MIL/uL (ref 4.22–5.81)
RDW: 14 % (ref 11.5–15.5)
WBC: 8.3 10*3/uL (ref 4.0–10.5)

## 2015-01-19 LAB — I-STAT TROPONIN, ED: TROPONIN I, POC: 0 ng/mL (ref 0.00–0.08)

## 2015-01-19 MED ORDER — IPRATROPIUM-ALBUTEROL 0.5-2.5 (3) MG/3ML IN SOLN
3.0000 mL | Freq: Once | RESPIRATORY_TRACT | Status: AC
  Administered 2015-01-19: 3 mL via RESPIRATORY_TRACT
  Filled 2015-01-19: qty 3

## 2015-01-19 NOTE — ED Provider Notes (Signed)
CSN: [id number]     Arrival date & time 01/19/15  2141 History  By signing my name below, I, Phillis Haggis, attest that this documentation has been prepared under the direction and in the presence of Dione Booze, MD. Electronically Signed: Phillis Haggis, ED Scribe. 01/19/2015. 12:07 AM.  Chief Complaint  Patient presents with  . Chest Pain   The history is provided by the patient. No language interpreter was used.  HPI Comments: Jeremy Horn is a 24 y.o. male who presents to the Emergency Department complaining of gradually worsening, sharp, intermittent right sided chest pain onset one week ago. Pt rates the pain 8/10 currently.  He reports associated intermittent SOB, and has been prescribed an inhaler that he states he uses as instructed which alleviates his SOB. Pt states that the chest pain worsens with breathing. He states that he will take Tramadol and Ibuprofen to relief, but states that the pain will come back. He states that he was seen at [facility] earlier this week for the same symptoms and was told that he had "blisters" on his lungs. Pt reports that he is a former smoker of tobacco and currently smokes marijuana. Pt denies current SOB, fever, chills, diaphoresis, cough, nausea, vomiting, and diarrhea.   History reviewed. No pertinent past medical history. History reviewed. No pertinent past surgical history. No family history on file. Social History  Substance Use Topics  . Smoking status: Former Smoker -- 0.00 packs/day    Quit date: 01/13/2015  . Smokeless tobacco: None  . Alcohol Use: Yes    Review of Systems  Constitutional: Negative for fever, chills and diaphoresis.  Respiratory: Positive for shortness of breath. Negative for cough.   Cardiovascular: Positive for chest pain.  Gastrointestinal: Negative for nausea, vomiting and diarrhea.  All other systems reviewed and are negative.  Allergies  Shellfish allergy  Home Medications   Prior to Admission  medications   Medication Sig Start Date End Date Taking? Authorizing Provider  albuterol (PROVENTIL HFA) 108 (90 BASE) MCG/ACT inhaler Inhale 2 puffs into the lungs every 4 (four) hours as needed for wheezing or shortness of breath.    Historical Provider, MD  ibuprofen (ADVIL,MOTRIN) 800 MG tablet Take 1 tablet (800 mg total) by mouth 3 (three) times daily. 01/16/15   Marily Memos, MD  traMADol (ULTRAM) 50 MG tablet Take 1 tablet (50 mg total) by mouth every 6 (six) hours as needed. 01/16/15   Marily Memos, MD   BP 121/69 mmHg  Pulse 63  Temp(Src) 97.9 F (36.6 C) (Oral)  Resp 20  Ht  (1.905 m)  Wt 280 lb (127.007 kg)  BMI 35.00 kg/m2  SpO2 99%  Physical Exam  Constitutional: He is oriented to person, place, and time. He appears well-developed and well-nourished.  HENT:  Head: Normocephalic and atraumatic.  Eyes: EOM are normal. Pupils are equal, round, and reactive to light.  Neck: Normal range of motion. Neck supple. No JVD present.  Cardiovascular: Normal rate, regular rhythm and normal heart sounds.   No murmur heard. Pulmonary/Chest: Effort normal and breath sounds normal. He has no wheezes. He has no rales. He exhibits no tenderness.  Abdominal: Soft. Bowel sounds are normal. He exhibits no distension and no mass. There is no tenderness.  Musculoskeletal: Normal range of motion. He exhibits no edema.  Lymphadenopathy:    He has no cervical adenopathy.  Neurological: He is alert and oriented to person, place, and [id number] cranial nerve deficit. He exhibits normal  muscle tone. Coordination normal.  Skin: Skin is warm and dry. No rash noted.  Psychiatric: He has a normal mood and affect. His behavior is normal. Judgment and thought content normal.  Nursing note and vitals reviewed.   ED Course  Procedures (including critical care time) DIAGNOSTIC STUDIES: Oxygen Saturation is 99% on RA, normal by my interpretation.    COORDINATION OF CARE: 11:13 PM-Discussed treatment  plan which includes CT scan, labs, EKG, and chest x-ray with pt at bedside and pt agreed to plan.   Labs Review Results for orders placed or performed during the hospital encounter of 01/19/15  Basic metabolic panel  Result Value Ref Range   Sodium 135 135 - 145 mmol/L   Potassium 3.5 3.5 - 5.1 mmol/L   Chloride 105 101 - 111 mmol/L   CO2 21 (L) 22 - 32 mmol/L   Glucose, Bld 104 (H) 65 - 99 mg/dL   BUN 8 6 - 20 mg/dL   Creatinine, Ser 0.98 0.61 - 1.24 mg/dL   Calcium 8.7 (L) 8.9 - 10.3 mg/dL   GFR calc non Af Amer >60 >60 mL/min   GFR calc Af Amer >60 >60 mL/min   Anion gap 9 5 - 15  CBC  Result Value Ref Range   WBC 8.3 4.0 - 10.5 K/uL   RBC 5.43 4.22 - 5.81 MIL/uL   Hemoglobin 14.8 13.0 - 17.0 g/dL   HCT 11.9 14.7 - 82.9 %   MCV 80.1 78.0 - 100.0 fL   MCH 27.3 26.0 - 34.0 pg   MCHC 34.0 30.0 - 36.0 g/dL   RDW 56.2 13.0 - 86.5 %   Platelets 246 150 - 400 K/uL  D-dimer, quantitative  Result Value Ref Range   D-Dimer, Quant <0.27 0.00 - 0.48 ug/mL-FEU  I-stat troponin, ED  Result Value Ref Range   Troponin i, poc 0.00 0.00 - 0.08 ng/mL   Comment 3           Imaging Review Dg Chest 2 View  01/19/2015   CLINICAL DATA:  Acute onset of right-sided chest pain and difficulty breathing. Initial encounter.  EXAM: CHEST  2 VIEW  COMPARISON:  Chest radiograph performed 01/16/2015  FINDINGS: The lungs are well-aerated. Minimal apical blebs are again noted. There is no evidence of focal opacification, pleural effusion or pneumothorax.  The heart is normal in size; the mediastinal contour is within normal limits. No acute osseous abnormalities are seen.  IMPRESSION: No acute cardiopulmonary process seen.   Electronically Signed   By: Roanna Raider M.D.   On: 01/19/2015 22:32   Images viewed by me.   EKG Interpretation   Date/Time:  Saturday January 19 2015 21:47:10 EDT Ventricular Rate:  73 PR Interval:  144 QRS Duration: 96 QT Interval:  380 QTC Calculation: 418 R Axis:    47 Text Interpretation:  Normal sinus rhythm Normal ECG When compared with  ECG of 01/16/2015, No significant change was found Confirmed by [facility]  MD,  Sakiya Stepka ([id number]) on 01/19/2015 11:12:15 PM      MDM   Final diagnoses:  Atypical chest pain    Sharp chest pain of uncertain cause. Old records are reviewed and he has to ED visits in the past week for similar complaints. Initial x-ray showed apical bullae consistent with early emphysema. Laboratory workup, ECG, and x-rays of been otherwise unremarkable. On since he has failed to respond to conservative measures and continues to have chest pain with occasional dyspnea, will screen for pulmonary embolism  with d-dimer.  D-dimer is come back normal. He had significant subjective relief with albuterol with ipratropium nebulizer treatment. He will be sent home with a prescription for prednisone. He is not been taking his ibuprofen on a regular basis. Is advised to start taking it 3 times a day every day until he runs out.    I, Mishell Donalson, personally performed the services described in this documentation. All medical record entries made by the scribe were at my direction and in my presence.  I have reviewed the chart and discharge instructions and agree that the record reflects my personal performance and is accurate and complete. Shirel Mallis.  01/20/2015. 12:30 AM.      Dione Booze, MD 01/20/15 0030

## 2015-01-19 NOTE — ED Notes (Signed)
Pt states over a week ago woke up unable to breathe with right sided cp worse with inspiration. Pt states that he went to [facility] and that he "had some sort of blisters" on his lungs. Pt denies N/V/D. Pt ambulatory and A/O x4.

## 2015-01-20 LAB — D-DIMER, QUANTITATIVE (NOT AT ARMC)

## 2015-01-20 MED ORDER — PREDNISONE 20 MG PO TABS: 60.0000 mg | ORAL_TABLET | Freq: Every day | ORAL | Status: DC

## 2015-01-20 MED ORDER — PREDNISONE 20 MG PO TABS
60.0000 mg | ORAL_TABLET | Freq: Once | ORAL | Status: AC
  Administered 2015-01-20: 60 mg via ORAL
  Filled 2015-01-20: qty 3

## 2015-01-20 NOTE — Discharge Instructions (Signed)
Take your ibuprofen 3 times a day every day until it runs out. It is okay to take acetaminophen (Tylenol) as needed for pain. Use tramadol for pain not relieved by acetaminophen. It is very important for you to avoid all types of smoking-cigarettes and marijuana.  Nonspecific Chest Pain  Chest pain can be caused by many different conditions. There is always a chance that your pain could be related to something serious, such as a heart attack or a blood clot in your lungs. Chest pain can also be caused by conditions that are not life-threatening. If you have chest pain, it is very important to follow up with your health care provider. CAUSES  Chest pain can be caused by:  Heartburn.  Pneumonia or bronchitis.  Anxiety or stress.  Inflammation around your heart (pericarditis) or lung (pleuritis or pleurisy).  A blood clot in your lung.  A collapsed lung (pneumothorax). It can develop suddenly on its own (spontaneous pneumothorax) or from trauma to the chest.  Shingles infection (varicella-zoster virus).  Heart attack.  Damage to the bones, muscles, and cartilage that make up your chest wall. This can include:  Bruised bones due to injury.  Strained muscles or cartilage due to frequent or repeated coughing or overwork.  Fracture to one or more ribs.  Sore cartilage due to inflammation (costochondritis). RISK FACTORS  Risk factors for chest pain may include:  Activities that increase your risk for trauma or injury to your chest.  Respiratory infections or conditions that cause frequent coughing.  Medical conditions or overeating that can cause heartburn.  Heart disease or family history of heart disease.  Conditions or health behaviors that increase your risk of developing a blood clot.  Having had chicken pox (varicella zoster). SIGNS AND SYMPTOMS Chest pain can feel like:  Burning or tingling on the surface of your chest or deep in your chest.  Crushing, pressure,  aching, or squeezing pain.  Dull or sharp pain that is worse when you move, cough, or take a deep breath.  Pain that is also felt in your back, neck, shoulder, or arm, or pain that spreads to any of these areas. Your chest pain may come and go, or it may stay constant. DIAGNOSIS Lab tests or other studies may be needed to find the cause of your pain. Your health care provider may have you take a test called an ambulatory ECG (electrocardiogram). An ECG records your heartbeat patterns at the time the test is performed. You may also have other tests, such as:  Transthoracic echocardiogram (TTE). During echocardiography, sound waves are used to create a picture of all of the heart structures and to look at how blood flows through your heart.  Transesophageal echocardiogram (TEE).This is a more advanced imaging test that obtains images from inside your body. It allows your health care provider to see your heart in finer detail.  Cardiac monitoring. This allows your health care provider to monitor your heart rate and rhythm in real time.  Holter monitor. This is a portable device that records your heartbeat and can help to diagnose abnormal heartbeats. It allows your health care provider to track your heart activity for several days, if needed.  Stress tests. These can be done through exercise or by taking medicine that makes your heart beat more quickly.  Blood tests.  Imaging tests. TREATMENT  Your treatment depends on what is causing your chest pain. Treatment may include:  Medicines. These may include:  Acid blockers for heartburn.  Anti-inflammatory medicine.  Pain medicine for inflammatory conditions.  Antibiotic medicine, if an infection is present.  Medicines to dissolve blood clots.  Medicines to treat coronary artery disease.  Supportive care for conditions that do not require medicines. This may include:  Resting.  Applying heat or cold packs to injured  areas.  Limiting activities until pain decreases. HOME CARE INSTRUCTIONS  If you were prescribed an antibiotic medicine, finish it all even if you start to feel better.  Avoid any activities that bring on chest pain.  Do not use any tobacco products, including cigarettes, chewing tobacco, or electronic cigarettes. If you need help quitting, ask your health care provider.  Do not drink alcohol.  Take medicines only as directed by your health care provider.  Keep all follow-up visits as directed by your health care provider. This is important. This includes any further testing if your chest pain does not go away.  If heartburn is the cause for your chest pain, you may be told to keep your head raised (elevated) while sleeping. This reduces the chance that acid will go from your stomach into your esophagus.  Make lifestyle changes as directed by your health care provider. These may include:  Getting regular exercise. Ask your health care provider to suggest some activities that are safe for you.  Eating a heart-healthy diet. A registered dietitian can help you to learn healthy eating options.  Maintaining a healthy weight.  Managing diabetes, if necessary.  Reducing stress. SEEK MEDICAL CARE IF:  Your chest pain does not go away after treatment.  You have a rash with blisters on your chest.  You have a fever. SEEK IMMEDIATE MEDICAL CARE IF:   Your chest pain is worse.  You have an increasing cough, or you cough up blood.  You have severe abdominal pain.  You have severe weakness.  You faint.  You have chills.  You have sudden, unexplained chest discomfort.  You have sudden, unexplained discomfort in your arms, back, neck, or jaw.  You have shortness of breath at any time.  You suddenly start to sweat, or your skin gets clammy.  You feel nauseous or you vomit.  You suddenly feel light-headed or dizzy.  Your heart begins to beat quickly, or it feels like it  is skipping beats. These symptoms may represent a serious problem that is an emergency. Do not wait to see if the symptoms will go away. Get medical help right away. Call your local emergency services (911 in the [place] Do not drive yourself to the hospital.   This information is not intended to replace advice given to you by your health care provider. Make sure you discuss any questions you have with your health care provider.   Document Released: 01/07/2005 Document Revised: 04/20/2014 Document Reviewed: 11/03/2013 Elsevier Interactive Patient Education 2016 [employer]  Prednisone tablets What is this medicine? PREDNISONE (PRED ni sone) is a corticosteroid. It is commonly used to treat inflammation of the skin, joints, lungs, and other organs. Common conditions treated include asthma, allergies, and arthritis. It is also used for other conditions, such as blood disorders and diseases of the adrenal glands. This medicine may be used for other purposes; ask your health care provider or pharmacist if you have questions. What should I tell my health care provider before I take this medicine? They need to know if you have any of these conditions: -Cushing's syndrome -diabetes -glaucoma -heart disease -high blood pressure -infection (especially a virus infection such as  chickenpox, cold sores, or herpes) -kidney disease -liver disease -mental illness -myasthenia gravis -osteoporosis -seizures -stomach or intestine problems -thyroid disease -an unusual or allergic reaction to lactose, prednisone, other medicines, foods, dyes, or preservatives -pregnant or trying to get pregnant -breast-feeding How should I use this medicine? Take this medicine by mouth with a glass of water. Follow the directions on the prescription label. Take this medicine with food. If you are taking this medicine once a day, take it in the morning. Do not take more medicine than you are told to take. Do not  suddenly stop taking your medicine because you may develop a severe reaction. Your doctor will tell you how much medicine to take. If your doctor wants you to stop the medicine, the dose may be slowly lowered over time to avoid any side effects. Talk to your pediatrician regarding the use of this medicine in children. Special care may be needed. Overdosage: If you think you have taken too much of this medicine contact a poison control center or emergency room at once. NOTE: This medicine is only for you. Do not share this medicine with others. What if I miss a dose? If you miss a dose, take it as soon as you can. If it is almost time for your next dose, talk to your doctor or health care professional. You may need to miss a dose or take an extra dose. Do not take double or extra doses without advice. What may interact with this medicine? Do not take this medicine with any of the following medications: -metyrapone -mifepristone This medicine may also interact with the following medications: -aminoglutethimide -amphotericin B -aspirin and aspirin-like medicines -barbiturates -certain medicines for diabetes, like glipizide or glyburide -cholestyramine -cholinesterase inhibitors -cyclosporine -digoxin -diuretics -ephedrine -male hormones, like estrogens and birth control pills -isoniazid -ketoconazole -NSAIDS, medicines for pain and inflammation, like ibuprofen or naproxen -phenytoin -rifampin -toxoids -vaccines -warfarin This list may not describe all possible interactions. Give your health care provider a list of all the medicines, herbs, non-prescription drugs, or dietary supplements you use. Also tell them if you smoke, drink alcohol, or use illegal drugs. Some items may interact with your medicine. What should I watch for while using this medicine? Visit your doctor or health care professional for regular checks on your progress. If you are taking this medicine over a prolonged  period, carry an identification card with your name and address, the type and dose of your medicine, and your doctor's name and address. This medicine may increase your risk of getting an infection. Tell your doctor or health care professional if you are around anyone with measles or chickenpox, or if you develop sores or blisters that do not heal properly. If you are going to have surgery, tell your doctor or health care professional that you have taken this medicine within the last twelve months. Ask your doctor or health care professional about your diet. You may need to lower the amount of salt you eat. This medicine may affect blood sugar levels. If you have diabetes, check with your doctor or health care professional before you change your diet or the dose of your diabetic medicine. What side effects may I notice from receiving this medicine? Side effects that you should report to your doctor or health care professional as soon as possible: -allergic reactions like skin rash, itching or hives, swelling of the face, lips, or tongue -changes in emotions or moods -changes in vision -depressed mood -eye pain -fever or chills,  cough, sore throat, pain or difficulty passing urine -increased thirst -swelling of ankles, feet Side effects that usually do not require medical attention (report to your doctor or health care professional if they continue or are bothersome): -confusion, excitement, restlessness -headache -nausea, vomiting -skin problems, acne, thin and shiny skin -trouble sleeping -weight gain This list may not describe all possible side effects. Call your doctor for medical advice about side effects. You may report side effects to FDA at 1-800-FDA-1088. Where should I keep my medicine? Keep out of the reach of children. Store at room temperature between 15 and 30 degrees C (59 and 86 degrees F). Protect from light. Keep container tightly closed. Throw away any unused medicine after  the expiration date. NOTE: This sheet is a summary. It may not cover all possible information. If you have questions about this medicine, talk to your doctor, pharmacist, or health care provider.    2016, Elsevier/Gold Standard. (2010-11-13 10:57:14)

## 2015-01-24 ENCOUNTER — Ambulatory Visit (INDEPENDENT_AMBULATORY_CARE_PROVIDER_SITE_OTHER): Payer: Self-pay | Admitting: Family Medicine

## 2015-01-24 ENCOUNTER — Encounter: Payer: Self-pay | Admitting: Family Medicine

## 2015-01-24 VITALS — BP 137/78 | HR 64 | Temp 97.7°F | Ht 75.0 in | Wt 290.0 lb

## 2015-01-24 DIAGNOSIS — E8881 Metabolic syndrome: Secondary | ICD-10-CM

## 2015-01-24 DIAGNOSIS — R0609 Other forms of dyspnea: Secondary | ICD-10-CM

## 2015-01-24 DIAGNOSIS — Z87891 Personal history of nicotine dependence: Secondary | ICD-10-CM

## 2015-01-24 DIAGNOSIS — J302 Other seasonal allergic rhinitis: Secondary | ICD-10-CM

## 2015-01-24 DIAGNOSIS — L83 Acanthosis nigricans: Secondary | ICD-10-CM | POA: Insufficient documentation

## 2015-01-24 DIAGNOSIS — Z23 Encounter for immunization: Secondary | ICD-10-CM

## 2015-01-24 DIAGNOSIS — R0789 Other chest pain: Secondary | ICD-10-CM | POA: Insufficient documentation

## 2015-01-24 LAB — COMPLETE METABOLIC PANEL WITH GFR
ALT: 30 U/L (ref 9–46)
AST: 17 U/L (ref 10–40)
Albumin: 4 g/dL (ref 3.6–5.1)
Alkaline Phosphatase: 76 U/L (ref 40–115)
BILIRUBIN TOTAL: 0.4 mg/dL (ref 0.2–1.2)
BUN: 9 mg/dL (ref 7–25)
CALCIUM: 8.9 mg/dL (ref 8.6–10.3)
CO2: 21 mmol/L (ref 20–31)
CREATININE: 0.76 mg/dL (ref 0.60–1.35)
Chloride: 108 mmol/L (ref 98–110)
Glucose, Bld: 92 mg/dL (ref 65–99)
Potassium: 4.3 mmol/L (ref 3.5–5.3)
Sodium: 139 mmol/L (ref 135–146)
TOTAL PROTEIN: 6.5 g/dL (ref 6.1–8.1)

## 2015-01-24 LAB — POCT URINALYSIS DIP (DEVICE)
BILIRUBIN URINE: NEGATIVE
Glucose, UA: NEGATIVE mg/dL
KETONES UR: NEGATIVE mg/dL
Nitrite: NEGATIVE
PH: 7 (ref 5.0–8.0)
Protein, ur: NEGATIVE mg/dL
SPECIFIC GRAVITY, URINE: 1.025 (ref 1.005–1.030)
Urobilinogen, UA: 0.2 mg/dL (ref 0.0–1.0)

## 2015-01-24 LAB — TSH: TSH: 2.565 u[IU]/mL (ref 0.350–4.500)

## 2015-01-24 MED ORDER — CETIRIZINE HCL 10 MG PO TABS: 10.0000 mg | ORAL_TABLET | Freq: Every day | ORAL | Status: DC

## 2015-01-24 MED ORDER — ACETAMINOPHEN 500 MG PO TABS: 500.0000 mg | ORAL_TABLET | Freq: Four times a day (QID) | ORAL | Status: DC | PRN

## 2015-01-24 NOTE — Patient Instructions (Signed)
Shortness of Breath Shortness of breath means you have trouble breathing. It could also mean that you have a medical problem. You should get immediate medical care for shortness of breath. CAUSES   Not enough oxygen in the air such as with high altitudes or a smoke-filled room.  Certain lung diseases, infections, or problems.  Heart disease or conditions, such as angina or heart failure.  Low red blood cells (anemia).  Poor physical fitness, which can cause shortness of breath when you exercise.  Chest or back injuries or stiffness.  Being overweight.  Smoking.  Anxiety, which can make you feel like you are not getting enough air. DIAGNOSIS  Serious medical problems can often be found during your physical exam. Tests may also be done to determine why you are having shortness of breath. Tests may include:  Chest X-rays.  Lung function tests.  Blood tests.  An electrocardiogram (ECG).  An ambulatory electrocardiogram. An ambulatory ECG records your heartbeat patterns over a 24-hour period.  Exercise testing.  A transthoracic echocardiogram (TTE). During echocardiography, sound waves are used to evaluate how blood flows through your heart.  A transesophageal echocardiogram (TEE).  Imaging scans. Your health care provider may not be able to find a cause for your shortness of breath after your exam. In this case, it is important to have a follow-up exam with your health care provider as directed.  TREATMENT  Treatment for shortness of breath depends on the cause of your symptoms and can vary greatly. HOME CARE INSTRUCTIONS   Do not smoke. Smoking is a common cause of shortness of breath. If you smoke, ask for help to quit.  Avoid being around chemicals or things that may bother your breathing, such as paint fumes and dust.  Rest as needed. Slowly resume your usual activities.  If medicines were prescribed, take them as directed for the full length of time directed. This  includes oxygen and any inhaled medicines.  Keep all follow-up appointments as directed by your health care provider. SEEK MEDICAL CARE IF:   Your condition does not improve in the time expected.  You have a hard time doing your normal activities even with rest.  You have any new symptoms. SEEK IMMEDIATE MEDICAL CARE IF:   Your shortness of breath gets worse.  You feel light-headed, faint, or develop a cough not controlled with medicines.  You start coughing up blood.  You have pain with breathing.  You have chest pain or pain in your arms, shoulders, or abdomen.  You have a fever.  You are unable to walk up stairs or exercise the way you normally do. MAKE SURE YOU:  Understand these instructions.  Will watch your condition.  Will get help right away if you are not doing well or get worse.   This information is not intended to replace advice given to you by your health care provider. Make sure you discuss any questions you have with your health care provider.   Document Released: 12/23/2000 Document Revised: 04/04/2013 Document Reviewed: 06/15/2011 Elsevier Interactive Patient Education 2016 Elsevier Inc. Diet for Metabolic Syndrome Metabolic syndrome is a disorder that includes at least three of these conditions:  Abdominal obesity.  Too much sugar in your blood.  High blood pressure.  Higher than normal amount of fat (lipids) in your blood.  Lower than normal level of "good" cholesterol (HDL). Following a healthy diet can help to keep metabolic syndrome under control. It can also help to prevent the development of conditions  that are associated with metabolic syndrome, such as diabetes, heart disease, and stroke. Along with exercise, a healthy diet:  Helps to improve the way that the body uses insulin.  Promotes weight loss. A common goal for people with this condition is to lose at least 7 to 10 percent of their starting weight. WHAT DO I NEED TO KNOW ABOUT  THIS DIET?  Use the glycemic index (GI) to plan your meals. The index tells you how quickly a food will raise your blood sugar. Choose foods that have low GI values. These foods take a longer time to raise blood sugar.  Keep track of how many calories you take in. Eating the right amount of calories will help your achieve a healthy weight.  You may want to follow a Mediterranean diet. This diet includes lots of vegetables, lean meats or fish, whole grains, fruits, and healthy oils and fats. WHAT FOODS CAN I EAT? Grains Stone-ground whole wheat. Pumpernickel bread. Whole-grain bread, crackers, tortillas, cereal, and pasta. Unsweetened oatmeal.Bulgur.Barley.Quinoa.Brown rice or wild rice. Vegetables Lettuce. Spinach. Peas. Beets. Cauliflower. Cabbage. Broccoli. Carrots. Tomatoes. Squash. Eggplant. Herbs. Peppers. Onions. Cucumbers. Brussels sprouts. Sweet potatoes. Yams. Beans. Lentils. Fruits Berries. Apples. Oranges. Grapes. Mango. Pomegranate. Kiwi. Cherries. Meats and Other Protein Sources Seafood and shellfish. Lean meats.Poultry. Tofu. Dairy Low-fat or fat-free dairy products, such as milk, yogurt, and cheese. Beverages Water. Low-fat milk. Milk alternatives, like soy milk or almond milk. Real fruit juice. Condiments Low-sugar or sugar-free ketchup, barbecue sauce, and mayonnaise. Mustard. Relish. Fats and Oils Avocado. Canola or olive oil. Nuts and nut butters.Seeds. The items listed above may not be a complete list of recommended foods or beverages. Contact your dietitian for more options.  WHAT FOODS ARE NOT RECOMMENDED? Red meat. Palm oil and coconut oil. Processed foods. Fried foods. Alcohol. Sweetened drinks, such as iced tea and soda. Sweets. Salty foods. The items listed above may not be a complete list of foods and beverages to avoid. Contact your dietitian for more information.   This information is not intended to replace advice given to you by your health care  provider. Make sure you discuss any questions you have with your health care provider.   Document Released: 08/14/2014 Document Reviewed: 08/14/2014 Elsevier Interactive Patient Education 2016 Elsevier Inc. Metabolic Syndrome Metabolic syndrome is the presence of at least three factors that increase your risk of getting cardiovascular disease and diabetes. These factors are:  High blood sugar.  High blood triglyceride level.  High blood pressure.  Low levels of good blood cholesterol (high-density lipoprotein or HDL).  Excess weight around the waist. This factor is present with a waist measurement of:  More than 40 inches in men.  More than 35 inches in women. Metabolic syndrome is sometimes called insulin resistance syndrome and syndrome X. CAUSES The exact cause is not known, but genetics and lifestyle choices play a role. RISK FACTORS You are more likely to develop metabolic syndrome if:  You eat a diet high in calories and saturated fat.  You do not exercise regularly.  You are overweight.  You have a family history of metabolic syndrome.  You are Asian.  You are older in age.  You have insulin resistance.  You use any tobacco products, including cigarettes, chewing tobacco, or electronic cigarettes. SIGNS AND SYMPTOMS Metabolic syndrome has no specific symptoms. DIAGNOSIS To make a diagnosis, your health care provider will determine whether you have at least three of the factors that make up metabolic syndrome by:  Taking your blood pressure.  Measuring your waist.  Ordering blood tests. TREATMENT Treatment may include:  Lifestyle changes to reduce your risk for heart disease and stroke, such as:  Exercise.  Weight loss.  Maintaining a healthy diet.  Quitting the use of any tobacco products, including cigarettes, chewing tobacco, or electronic cigarettes.  Medicines that:  Help your body to maintain glucose control.  Reduce your blood pressure  and your blood triglyceride levels. HOME CARE INSTRUCTIONS  Exercise regularly.  Maintain a healthy diet.  Do not use any tobacco products, including cigarettes, chewing tobacco, or electronic cigarettes. If you need help quitting, ask your health care provider.  Keep all follow-up visits as directed by your health care provider. This is important.  Measure your waist regularly and record the measurement. To measure your waist:  Stand up straight.  Breathe out.  Wrap the measuring tape around the part of your waist that is just above your hipbones.  Read the measurement. SEEK MEDICAL CARE IF:  You feel very tired.  You develop excessive thirst.  You pass large quantities of urine.  You put on weight around your waist.  You have headaches over and over again.  You have a dizzy spell. SEEK IMMEDIATE MEDICAL CARE IF:  You develop sudden blurred vision.  You develop a sudden dizzy spell.  You have sudden trouble speaking or swallowing.  You have sudden weakness in your arm or leg.  You have chest pains or trouble breathing.  You feel like your heartbeat is abnormal.  You faint.   This information is not intended to replace advice given to you by your health care provider. Make sure you discuss any questions you have with your health care provider.   Document Released: 07/07/2007 Document Revised: 04/20/2014 Document Reviewed: 11/03/2013 Elsevier Interactive Patient Education 2016 Elsevier Inc. Acanthosis Nigricans Acanthosis nigricans is a disorder in which dark, velvety markings appear on the skin. CAUSES This condition may be caused by:  A hormonal or glandular disorder, such as diabetes.  Obesity.  Certain medicines, such as birth control pills.  A tumor. (This is rare.) Some people inherit the condition from their parents. RISK FACTORS This condition is more likely to develop in:  People who have a hormonal or glandular disorder.  People who are  overweight.  People who take certain medicines.  People who have certain cancers, especially stomach cancer.  People who have dark-colored skin (dark complexion). SYMPTOMS The main symptom of this condition is velvety markings on the skin that are light brown, black, or grayish in color. The markings usually appear on the face, neck, armpits, inner thighs, and groin. In severe cases, markings may also appear on the lips, hands, breasts, eyelids, and mouth. DIAGNOSIS This condition may be diagnosed based on symptoms. Sometimes, a skin sample is taken for testing (skin biopsy). You may also have tests to help determine the cause of the condition. TREATMENT Treatment for this condition depends on the cause. Treatment may involve reducing insulin levels, which are often high in people who have this condition. Insulin levels can be reduced with:  Dietary changes, such as avoiding starchy foods and sugars.  Losing weight.  Medicines. Sometimes, treatment involves:  Medicines to improve the appearance of the skin.  Laser treatment to improve the appearance of the skin.  Surgical removal of the skin markings (dermabrasion). HOME CARE INSTRUCTIONS  Follow diet instructions from your health care provider.  Lose weight if you are overweight.  Take over-the-counter and  prescription medicines only as told by your health care provider.  Keep all follow-up visits as told by your health care provider. This is important. SEEK MEDICAL CARE IF:  The skin markings do not go away with treatment.  New skin markings develop on a part of the body where they rarely develop, such as on your lips, hands, breasts, eyelids, or mouth.  The condition recurs for an unknown reason.   This information is not intended to replace advice given to you by your health care provider. Make sure you discuss any questions you have with your health care provider.   Document Released: 03/30/2005 Document Revised:  12/19/2014 Document Reviewed: 05/24/2014 Elsevier Interactive Patient Education [employer]2016 Elsevier Inc.

## 2015-01-24 NOTE — Progress Notes (Signed)
Subjective:    Patient ID: Jeremy CarpenMichael F Horn, male    DOB: 03/25/1991, 24 y.o.   MRN: [id number]  HPI  Ms. MeadWestvacoMichael Horn, a [age] year old male presents to establish care. Patient was recently evaluated in the emergency department for shortness of breath and chest wall pains. He states that symptoms have improved on prednisone and albuterol. Patient is also complaining of periodic chest wall pain. He states that pain primarily occurs to right chest. Current pain intensity is 4/10 described as intermittent and aching. He last had Ibuprofen a few days ago. Patient reports that he quit smoking on 01/12/2015. He states that he had been smoking since he was 24 years old. He denies fatigue, headache, fever, heart palpitations, muscle aches, nausea, vomiting, or diarrhea.   History reviewed. No pertinent past medical history. Immunization History  Administered Date(s) Administered  . Influenza,inj,Quad PF,36+ Mos 01/24/2015  . Pneumococcal Polysaccharide-23 01/24/2015  . Tdap 03/25/2011   Social History   Social History  . Marital Status: Single    Spouse Name: N/A  . Number of Children: N/A  . Years of Education: N/A   Occupational History  . Not on file.   Social History Main Topics  . Smoking status: Former Smoker -- 0.00 packs/day    Quit date: 01/13/2015  . Smokeless tobacco: Not on file  . Alcohol Use: Yes  . Drug Use: Yes    Special: Marijuana  . Sexual Activity: Yes   Other Topics Concern  . Not on file   Social History Narrative    Review of Systems  Constitutional: Negative for fever and fatigue.  HENT: Positive for congestion. Negative for nosebleeds, postnasal drip, rhinorrhea and sore throat.   Eyes: Negative for photophobia.  Respiratory: Negative.   Cardiovascular: Negative.   Gastrointestinal: Negative.   Endocrine: Positive for polydipsia, polyphagia and polyuria.  Genitourinary: Negative.  Negative for dysuria, urgency, decreased urine volume, penile  swelling, penile pain and testicular pain.  Musculoskeletal: Negative.   Skin: Negative.   Allergic/Immunologic: Positive for environmental allergies. Negative for immunocompromised state.  Neurological: Negative.  Negative for dizziness and light-headedness.  Hematological: Negative.   Psychiatric/Behavioral: Negative.  Negative for suicidal ideas and sleep disturbance.      Objective:   Physical Exam  Constitutional: He is oriented to person, place, and time. He appears well-developed and well-nourished.  HENT:  Head: Normocephalic and atraumatic.  Right Ear: External ear normal.  Left Ear: External ear normal.  Mouth/Throat: Oropharynx is clear and moist.  Eyes: Conjunctivae and EOM are normal. Pupils are equal, round, and reactive to light.  Neck: Normal range of motion. Neck supple.  Cardiovascular: Normal rate, regular rhythm, normal heart sounds and intact distal pulses.   Pulmonary/Chest: Effort normal. No apnea, no tachypnea and no bradypnea. He has decreased breath sounds in the right lower field and the left lower field. He exhibits tenderness (right chest wall tenderness).  Abdominal: Soft. Bowel sounds are normal.  Musculoskeletal: Normal range of motion.  Neurological: He is alert and oriented to person, place, and time. He has normal reflexes. No cranial nerve deficit. Coordination normal.  Skin: Skin is warm and dry.     Psychiatric: He has a normal mood and affect. His behavior is normal. Judgment and thought content normal.      BP 137/78 mmHg  Pulse 64  Temp(Src) 97.7 F (36.5 C) (Oral)  Ht 6\' 3"  (1.905 m)  Wt 290 lb (131.543 kg)  BMI 36.25 kg/m2 Assessment &  Plan:  1. Dyspnea on exertion Patient reports shortness of breath with climbing stairs and walking long distances. He reports that shortness of breath has improved since he stopped smoking. Encouraged patient to continue smoking cessation and complete course of antibiotics. Discussed when to use  albuterol rescue inhaler at length  2. Chest wall pain Right chest wall tender to touch. Reviewed chest xray from 01/19/2015.  - acetaminophen (TYLENOL) 500 MG tablet; Take 1 tablet (500 mg total) by mouth every 6 (six) hours as needed.  Dispense: 30 tablet; Refill: 0  3. Seasonal allergies  - cetirizine (ZYRTEC) 10 MG tablet; Take 1 tablet (10 mg total) by mouth daily.  Dispense: 30 tablet; Refill: 11  4. Acanthosis nigricans Will screen for diabetes mellitus  5. Metabolic syndrome  - POCT urinalysis dipstick - Hemoglobin A1c - COMPLETE METABOLIC PANEL WITH GFR - CBC with Differential - TSH  6. Former smoker Smoking cessation instruction/counseling given:  commended patient for quitting and reviewed strategies for preventing relapses  7. Need for immunization against influenza  - Flu Vaccine QUAD 36+ mos IM (Fluarix)  8. Immunization due  - Pneumococcal polysaccharide vaccine 23-valent greater than or equal to 2yo subcutaneous/IM   RTC: After reviewing lab results, will schedule follow-up  The patient was given clear instructions to go to ER or return to medical center if symptoms do not improve, worsen or new problems develop. The patient verbalized understanding. Will notify patient with laboratory results.   Massie Maroon, FNP

## 2015-01-25 ENCOUNTER — Telehealth: Payer: Self-pay | Admitting: Family Medicine

## 2015-01-25 DIAGNOSIS — R7303 Prediabetes: Secondary | ICD-10-CM

## 2015-01-25 LAB — CBC WITH DIFFERENTIAL/PLATELET
BASOS ABS: 0.1 10*3/uL (ref 0.0–0.1)
Basophils Relative: 1 % (ref 0–1)
EOS PCT: 2 % (ref 0–5)
Eosinophils Absolute: 0.2 10*3/uL (ref 0.0–0.7)
HEMATOCRIT: 44.4 % (ref 39.0–52.0)
Hemoglobin: 15 g/dL (ref 13.0–17.0)
LYMPHS ABS: 2.2 10*3/uL (ref 0.7–4.0)
LYMPHS PCT: 28 % (ref 12–46)
MCH: 27.1 pg (ref 26.0–34.0)
MCHC: 33.8 g/dL (ref 30.0–36.0)
MCV: 80.1 fL (ref 78.0–100.0)
MONOS PCT: 5 % (ref 3–12)
MPV: 9.8 fL (ref 8.6–12.4)
Monocytes Absolute: 0.4 10*3/uL (ref 0.1–1.0)
Neutro Abs: 5.1 10*3/uL (ref 1.7–7.7)
Neutrophils Relative %: 64 % (ref 43–77)
Platelets: 258 10*3/uL (ref 150–400)
RBC: 5.54 MIL/uL (ref 4.22–5.81)
RDW: 14.8 % (ref 11.5–15.5)
WBC: 8 10*3/uL (ref 4.0–10.5)

## 2015-01-25 LAB — HEMOGLOBIN A1C
HEMOGLOBIN A1C: 5.8 % — AB (ref <5.7)
MEAN PLASMA GLUCOSE: 120 mg/dL — AB (ref <117)

## 2015-01-25 NOTE — Telephone Encounter (Signed)
Reviewed labs, hemoglobin a1C 5.8. Goal is <5.7%. Recommend a lowfat, low carbohydrate diet divided over 5-6 small meals, increase water intake to 6-8 glasses, and 150 minutes per week of cardiovascular exercise.  Patient is to follow-up in office in 6 months for pre-diabetes and metabolic syndrome.   Jeremy MaroonHollis,[name], FNP

## 2015-01-29 ENCOUNTER — Other Ambulatory Visit: Payer: Self-pay

## 2015-01-29 MED ORDER — ALBUTEROL SULFATE HFA 108 (90 BASE) MCG/ACT IN AERS: 2.0000 | INHALATION_SPRAY | RESPIRATORY_TRACT | Status: DC | PRN

## 2015-01-29 NOTE — Telephone Encounter (Signed)
Albuterol has been refilled and sent into pharmacy. Thanks!

## 2016-08-31 ENCOUNTER — Emergency Department (HOSPITAL_BASED_OUTPATIENT_CLINIC_OR_DEPARTMENT_OTHER): Payer: Self-pay

## 2016-08-31 ENCOUNTER — Emergency Department (HOSPITAL_BASED_OUTPATIENT_CLINIC_OR_DEPARTMENT_OTHER)
Admission: EM | Admit: 2016-08-31 | Discharge: 2016-09-01 | Disposition: A | Discharge status: Home / Self Care | Payer: Self-pay | Attending: Emergency Medicine | Admitting: Emergency Medicine

## 2016-08-31 ENCOUNTER — Encounter (HOSPITAL_BASED_OUTPATIENT_CLINIC_OR_DEPARTMENT_OTHER): Payer: Self-pay | Admitting: *Deleted

## 2016-08-31 DIAGNOSIS — S0083XA Contusion of other part of head, initial encounter: Secondary | ICD-10-CM | POA: Insufficient documentation

## 2016-08-31 DIAGNOSIS — T1490XA Injury, unspecified, initial encounter: Secondary | ICD-10-CM

## 2016-08-31 DIAGNOSIS — Z87891 Personal history of nicotine dependence: Secondary | ICD-10-CM | POA: Insufficient documentation

## 2016-08-31 DIAGNOSIS — Y999 Unspecified external cause status: Secondary | ICD-10-CM | POA: Insufficient documentation

## 2016-08-31 DIAGNOSIS — Y929 Unspecified place or not applicable: Secondary | ICD-10-CM | POA: Insufficient documentation

## 2016-08-31 DIAGNOSIS — W500XXA Accidental hit or strike by another person, initial encounter: Secondary | ICD-10-CM | POA: Insufficient documentation

## 2016-08-31 DIAGNOSIS — Y9367 Activity, basketball: Secondary | ICD-10-CM | POA: Insufficient documentation

## 2016-08-31 NOTE — ED Triage Notes (Addendum)
States that he was elbowed in the right jaw yesterday when playing basketball.   States malocclusion-no difficulty with speaking, no obvious deformity or swelling.

## 2016-09-01 MED ORDER — IBUPROFEN 800 MG PO TABS: 800.0000 mg | ORAL_TABLET | Freq: Three times a day (TID) | ORAL | 0 refills | Status: AC | PRN

## 2016-09-01 NOTE — ED Provider Notes (Signed)
[facility]   CSN: [id number] Arrival date & time: 08/31/16  2209     History   Chief Complaint Chief Complaint  Patient presents with  . Jaw Pain    HPI Jeremy Horn is a [age] y.o. male.  HPI Patient presents to the emergency department with jaw pain following an injury that occurred yesterday.  The patient states that he was playing basketball when he was elbowed in the face.  Patient states that he feels like he is having pain with movements of the jaw.  The patient states that he did use some ice over the area did not take any medications prior trauma.  Nothing seems make the condition better.  Patient denies loss consciousness, headache, blurred vision, neck pain, chest pain, shortness of breath, nausea, vomiting, or syncope History reviewed. No pertinent past medical history.  Patient Active Problem List   Diagnosis Date Noted  . Prediabetes 01/25/2015  . Dyspnea on exertion 01/24/2015  . Chest wall pain 01/24/2015  . Seasonal allergies 01/24/2015  . Acanthosis nigricans 01/24/2015  . Metabolic syndrome 01/24/2015  . Former smoker 01/24/2015    History reviewed. No pertinent surgical history.     Home Medications    Prior to Admission medications   Not on File    Family History Family History  Problem Relation Age of Onset  . Diabetes Mother   . Heart disease Father     Social History Social History  Substance Use Topics  . Smoking status: Former Smoker    Packs/day: 0.00    Quit date: 01/13/2015  . Smokeless tobacco: Not on file  . Alcohol use Yes     Allergies   Shellfish allergy   Review of Systems Review of Systems All other systems negative except as documented in the HPI. All pertinent positives and negatives as reviewed in the HPI.  Physical Exam Updated Vital Signs BP 114/81 (BP Location: Right Arm)   Pulse 74   Temp 98 F (36.7 C) (Oral)   Resp 18   Ht 6\' 3"  (1.905 m)   Wt 127 kg (280 lb)   SpO2  98%   BMI 35.00 kg/m   Physical Exam  Constitutional: He is oriented to person, place, and time. He appears well-developed and well-nourished. No distress.  HENT:  Head: Normocephalic and atraumatic.  Mouth/Throat:    Eyes: Pupils are equal, round, and reactive to light.  Pulmonary/Chest: Effort normal and breath sounds normal.  Neurological: He is alert and oriented to person, place, and time.  Skin: Skin is warm and dry.  Psychiatric: He has a normal mood and affect.  Nursing note and vitals reviewed.    ED Treatments / Results  Labs (all labs ordered are listed, but only abnormal results are displayed) Labs Reviewed - No data to display  EKG  EKG Interpretation None       Radiology Ct Maxillofacial Wo Contrast  Result Date: 08/31/2016 CLINICAL DATA:  Initial evaluation for acute trauma, elbow and an right show off. EXAM: CT MAXILLOFACIAL WITHOUT CONTRAST TECHNIQUE: Multidetector CT imaging of the maxillofacial structures was performed. Multiplanar CT image reconstructions were also generated. A small metallic BB was placed on the right temple in order to reliably differentiate right from left. COMPARISON:  Prior CT from 05/14/2012. FINDINGS: Osseous: Zygomatic arches intact. No acute maxillary fracture. Pterygoid plates intact. Nasal bones intact. Nasal septum mildly bowed to the right but intact. No acute mandibular fracture. Mandibular condyles normally situated. No  acute abnormality about the dentition. Orbits: Globes intact. No retro-orbital hematoma or other pathology. Bony orbits intact without evidence for orbital floor fracture. Sinuses: Moderate mucosal thickening throughout the maxillary sinuses and ethmoidal air cells. Superimposed fluid levels present within the maxillary sinuses bilaterally. Changes are likely allergic/inflammatory nature. Mastoids are clear. Soft tissues: No appreciable soft tissue injury within the face. Limited intracranial: Unremarkable.  IMPRESSION: 1. No acute maxillofacial injury identified.  No fracture. 2. Acute moderate maxillary and ethmoidal sinusitis, which may be either infectious or inflammatory in nature. Electronically Signed   By: Rise Mu M.D.   On: 08/31/2016 22:58    Procedures Procedures (including critical care time)  Medications Ordered in ED Medications - No data to display   Initial Impression / Assessment and Plan / ED Course  I have reviewed the triage vital signs and the nursing notes.  Pertinent labs & imaging results that were available during my care of the patient were reviewed by me and considered in my medical decision making (see chart for details).     Patient will be treated for contusion of the jaw and face.  Patient is advised to return here as needed.  Patient is advised return here as needed.  Patient agrees the plan and all questions were answered  Final Clinical Impressions(s) / ED Diagnoses   Final diagnoses:  Injury    New Prescriptions New Prescriptions   No medications on file     Kyra Manges 09/01/16 0020    Molpus, Jonny Ruiz, MD 09/01/16 [phone]

## 2016-09-01 NOTE — Discharge Instructions (Signed)
Return here as needed.  Follow-up with a primary care doctor or an urgent care.  Use ice and heat over the area that is sore.

## 2017-02-02 ENCOUNTER — Emergency Department (HOSPITAL_COMMUNITY)
Admission: EM | Admit: 2017-02-02 | Discharge: 2017-02-02 | Disposition: A | Discharge status: Home / Self Care | Payer: Self-pay | Attending: Emergency Medicine | Admitting: Emergency Medicine

## 2017-02-02 ENCOUNTER — Encounter (HOSPITAL_COMMUNITY): Payer: Self-pay | Admitting: Emergency Medicine

## 2017-02-02 DIAGNOSIS — R7303 Prediabetes: Secondary | ICD-10-CM | POA: Insufficient documentation

## 2017-02-02 DIAGNOSIS — S80861A Insect bite (nonvenomous), right lower leg, initial encounter: Secondary | ICD-10-CM | POA: Insufficient documentation

## 2017-02-02 DIAGNOSIS — Y9389 Activity, other specified: Secondary | ICD-10-CM | POA: Insufficient documentation

## 2017-02-02 DIAGNOSIS — Z87891 Personal history of nicotine dependence: Secondary | ICD-10-CM | POA: Insufficient documentation

## 2017-02-02 DIAGNOSIS — Y929 Unspecified place or not applicable: Secondary | ICD-10-CM | POA: Insufficient documentation

## 2017-02-02 DIAGNOSIS — L02415 Cutaneous abscess of right lower limb: Secondary | ICD-10-CM | POA: Insufficient documentation

## 2017-02-02 DIAGNOSIS — W57XXXA Bitten or stung by nonvenomous insect and other nonvenomous arthropods, initial encounter: Secondary | ICD-10-CM | POA: Insufficient documentation

## 2017-02-02 DIAGNOSIS — Y998 Other external cause status: Secondary | ICD-10-CM | POA: Insufficient documentation

## 2017-02-02 MED ORDER — SULFAMETHOXAZOLE-TRIMETHOPRIM 800-160 MG PO TABS: 1.0000 | ORAL_TABLET | Freq: Two times a day (BID) | ORAL | 0 refills | Status: DC

## 2017-02-02 NOTE — Discharge Instructions (Signed)
Please read and follow all provided instructions.  Your diagnoses today include:  1. Insect bite, initial encounter   2. Cutaneous abscess of right lower extremity     Tests performed today include: Vital signs. See below for your results today.   Medications prescribed:  Take as prescribed   Home care instructions:  Follow any educational materials contained in this packet.  Follow-up instructions: Please follow-up with your primary care provider for further evaluation of symptoms and treatment   Return instructions:  Please return to the Emergency Department if you do not get better, if you get worse, or new symptoms OR  - Fever (temperature greater than 101.34F)  - Bleeding that does not stop with holding pressure to the area    -Severe pain (please note that you may be more sore the day after your accident)  - Chest Pain  - Difficulty breathing  - Severe nausea or vomiting  - Inability to tolerate food and liquids  - Passing out  - Skin becoming red around your wounds  - Change in mental status (confusion or lethargy)  - New numbness or weakness    Please return if you have any other emergent concerns.  Additional Information:  Your vital signs today were: BP 126/81 (BP Location: Left Arm)    Pulse 77    Temp 97.8 F (36.6 C) (Oral)    Resp 16    Ht 6\' 3"  (1.905 m)    Wt 124.3 kg (274 lb)    SpO2 99%    BMI 34.25 kg/m  If your blood pressure (BP) was elevated above 135/85 this visit, please have this repeated by your doctor within one month. ---------------

## 2017-02-02 NOTE — ED Triage Notes (Signed)
Patient has possible insect bite on lower lateral side of right leg that he noticed yesterday. Patient reports pain, swelling at site and some pus drainage.

## 2017-02-02 NOTE — ED Provider Notes (Signed)
[facility]-EMERGENCY DEPT Provider Note   CSN: [id number] Arrival date & time: 02/02/17  4098     History   Chief Complaint Chief Complaint  Patient presents with  . Insect Bite    HPI Jeremy Horn is a 26 y.o. male.  HPI  26 y.o. male, presents to the Emergency Department today due to insect bite. Noted mild swelling and redness around bite x 2 days ago. Purulence expressed at one time yesterday. Mild redness. No fevers. Pt cleaned with alcohol wipes. No numbness/tingling. No N/V. No chills/rigors. Rates pain 3/10. Throbbing. No meds PTA. No other symptoms noted   History reviewed. No pertinent past medical history.  Patient Active Problem List   Diagnosis Date Noted  . Prediabetes 01/25/2015  . Dyspnea on exertion 01/24/2015  . Chest wall pain 01/24/2015  . Seasonal allergies 01/24/2015  . Acanthosis nigricans 01/24/2015  . Metabolic syndrome 01/24/2015  . Former smoker 01/24/2015    History reviewed. No pertinent surgical history.     Home Medications    Prior to Admission medications   Medication Sig Start Date End Date Taking? Authorizing Provider  ibuprofen (ADVIL,MOTRIN) 800 MG tablet Take 1 tablet (800 mg total) by mouth every 8 (eight) hours as needed. 09/01/16   Charlestine Night, PA-C    Family History Family History  Problem Relation Age of Onset  . Diabetes Mother   . Heart disease Father     Social History Social History  Substance Use Topics  . Smoking status: Former Smoker    Packs/day: 0.00    Quit date: 01/13/2015  . Smokeless tobacco: Never Used  . Alcohol use Yes     Allergies   Shellfish allergy   Review of Systems Review of Systems ROS reviewed and all are negative for acute change except as noted in the HPI.  Physical Exam Updated Vital Signs BP 126/81 (BP Location: Left Arm)   Pulse 77   Temp 97.8 F (36.6 C) (Oral)   Resp 16   Ht 6\' 3"  (1.905 m)   Wt 124.3 kg (274 lb)   SpO2 99%    BMI 34.25 kg/m   Physical Exam  Constitutional: He is oriented to person, place, and time. Vital signs are normal. He appears well-developed and well-nourished.  HENT:  Head: Normocephalic and atraumatic.  Right Ear: Hearing normal.  Left Ear: Hearing normal.  Eyes: Pupils are equal, round, and reactive to light. Conjunctivae and EOM are normal.  Neck: Normal range of motion. Neck supple.  Cardiovascular: Normal rate and regular rhythm.   Pulmonary/Chest: Effort normal.  Musculoskeletal:  RLE with small fluctuance <0.5cm abscess. Mild erythema. Mild induration surrounding. No red streaking.   Neurological: He is alert and oriented to person, place, and time.  Skin: Skin is warm and dry.  Psychiatric: He has a normal mood and affect. His speech is normal and behavior is normal. Thought content normal.  Nursing note and vitals reviewed.    ED Treatments / Results  Labs (all labs ordered are listed, but only abnormal results are displayed) Labs Reviewed - No data to display  EKG  EKG Interpretation None       Radiology No results found.  Procedures Procedures (including critical care time)  Medications Ordered in ED Medications - No data to display   Initial Impression / Assessment and Plan / ED Course  I have reviewed the triage vital signs and the nursing notes.  Pertinent labs & imaging results that were available  during my care of the patient were reviewed by me and considered in my medical decision making (see chart for details).  Final Clinical Impressions(s) / ED Diagnoses     {I have reviewed the relevant previous healthcare records.  {I obtained HPI from historian.   ED Course:  Assessment: Jeremy Horn is a 26 y.o. male who presents to ED for abscess not requiring incision and drainage. Mild erythema. Patient was prescribed Bactrim. Counseled on warm compresses. Wound check in 2-3 days. Return to ER if concern for spread of infection, increasing pain,  fevers or other concerns. All questions answered.  Disposition/Plan:  DC Home Additional Verbal discharge instructions given and discussed with patient.  Pt Instructed to f/u with PCP in the next week for evaluation and treatment of symptoms. Return precautions given Pt acknowledges and agrees with plan  Supervising Physician Bethann BerkshireZammit, Joseph, MD  Final diagnoses:  Insect bite, initial encounter  Cutaneous abscess of right lower extremity    New Prescriptions New Prescriptions   No medications on file     Audry PiliMohr, Loukas Antonson, Cordelia Poche-C 02/02/17 1108    Bethann BerkshireZammit, Joseph, MD 02/03/17 [phone]

## 2017-02-02 NOTE — ED Notes (Signed)
Pt given a hotpack with discharge paperwork to put on the bite area

## 2017-02-06 ENCOUNTER — Emergency Department (HOSPITAL_COMMUNITY)
Admission: EM | Admit: 2017-02-06 | Discharge: 2017-02-06 | Disposition: A | Discharge status: Home / Self Care | Payer: Self-pay | Attending: Emergency Medicine | Admitting: Emergency Medicine

## 2017-02-06 ENCOUNTER — Encounter (HOSPITAL_COMMUNITY): Payer: Self-pay

## 2017-02-06 DIAGNOSIS — Z87891 Personal history of nicotine dependence: Secondary | ICD-10-CM | POA: Insufficient documentation

## 2017-02-06 DIAGNOSIS — L298 Other pruritus: Secondary | ICD-10-CM | POA: Insufficient documentation

## 2017-02-06 DIAGNOSIS — T7840XA Allergy, unspecified, initial encounter: Secondary | ICD-10-CM | POA: Insufficient documentation

## 2017-02-06 DIAGNOSIS — L500 Allergic urticaria: Secondary | ICD-10-CM | POA: Insufficient documentation

## 2017-02-06 MED ORDER — METHYLPREDNISOLONE SODIUM SUCC 125 MG IJ SOLR
125.0000 mg | Freq: Once | INTRAMUSCULAR | Status: AC
  Administered 2017-02-06: 125 mg via INTRAVENOUS
  Filled 2017-02-06: qty 2

## 2017-02-06 MED ORDER — FAMOTIDINE IN NACL 20-0.9 MG/50ML-% IV SOLN
20.0000 mg | Freq: Once | INTRAVENOUS | Status: AC
  Administered 2017-02-06: 20 mg via INTRAVENOUS
  Filled 2017-02-06: qty 50

## 2017-02-06 MED ORDER — CLINDAMYCIN HCL 150 MG PO CAPS: 150.0000 mg | ORAL_CAPSULE | Freq: Four times a day (QID) | ORAL | 0 refills | Status: AC

## 2017-02-06 MED ORDER — DIPHENHYDRAMINE HCL 50 MG/ML IJ SOLN
50.0000 mg | Freq: Once | INTRAMUSCULAR | Status: AC
  Administered 2017-02-06: 50 mg via INTRAVENOUS
  Filled 2017-02-06: qty 1

## 2017-02-06 NOTE — Discharge Instructions (Signed)
Use Benadryl as directed

## 2017-02-06 NOTE — ED Provider Notes (Signed)
[facility]-EMERGENCY DEPT Provider Note   CSN: [id number] Arrival date & time: 02/06/17  0707     History   Chief Complaint Chief Complaint  Patient presents with  . Pruritis  . Urticaria    HPI Edwards Mckelvie Hindes is a 26 y.o. male.  26 year old male presents with diffuse hive-like rash which began this morning.  Was seen here 3 days ago and treated with Bactrim for an abscess.  Patient did use a new body wash yesterday.  Now complains of diffuse pruritic rash without fever or oral involvement.  He denies any trouble breathing or trouble swallowing.  Symptoms have been persistent and no treatment use prior to arrival.      History reviewed. No pertinent past medical history.  Patient Active Problem List   Diagnosis Date Noted  . Prediabetes 01/25/2015  . Dyspnea on exertion 01/24/2015  . Chest wall pain 01/24/2015  . Seasonal allergies 01/24/2015  . Acanthosis nigricans 01/24/2015  . Metabolic syndrome 01/24/2015  . Former smoker 01/24/2015    History reviewed. No pertinent surgical history.     Home Medications    Prior to Admission medications   Medication Sig Start Date End Date Taking? Authorizing Provider  ibuprofen (ADVIL,MOTRIN) 800 MG tablet Take 1 tablet (800 mg total) by mouth every 8 (eight) hours as needed. 09/01/16   Lawyer, Cristal Deer, PA-C  sulfamethoxazole-trimethoprim (BACTRIM DS,SEPTRA DS) 800-160 MG tablet Take 1 tablet by mouth 2 (two) times daily. 02/02/17 02/09/17  Audry Pili, PA-C    Family History Family History  Problem Relation Age of Onset  . Diabetes Mother   . Heart disease Father     Social History Social History  Substance Use Topics  . Smoking status: Former Smoker    Packs/day: 0.00    Types: Cigarettes    Quit date: 01/13/2015  . Smokeless tobacco: Never Used  . Alcohol use Yes     Allergies   Shellfish allergy   Review of Systems Review of Systems  All other systems reviewed and are  negative.    Physical Exam Updated Vital Signs BP (!) 136/110 (BP Location: Right Arm)   Pulse 92   Temp 98 F (36.7 C) (Oral)   Resp 17   Ht 1.905 m (6\' 3" )   Wt 127 kg (280 lb)   SpO2 96%   BMI 35.00 kg/m   Physical Exam  Constitutional: He is oriented to person, place, and time. He appears well-developed and well-nourished.  Non-toxic appearance. No distress.  HENT:  Head: Normocephalic and atraumatic.  Mouth/Throat: Oropharynx is clear and moist.  Eyes: Pupils are equal, round, and reactive to light. Conjunctivae, EOM and lids are normal.  Neck: Normal range of motion. Neck supple. No tracheal deviation present. No thyroid mass present.  Cardiovascular: Normal rate, regular rhythm and normal heart sounds.  Exam reveals no gallop.   No murmur heard. Pulmonary/Chest: Effort normal and breath sounds normal. No stridor. No respiratory distress. He has no decreased breath sounds. He has no wheezes. He has no rhonchi. He has no rales.  Abdominal: Soft. Normal appearance and bowel sounds are normal. He exhibits no distension. There is no tenderness. There is no rebound and no CVA tenderness.  Musculoskeletal: Normal range of motion. He exhibits no edema or tenderness.  Neurological: He is alert and oriented to person, place, and time. He has normal strength. No cranial nerve deficit or sensory deficit. GCS eye subscore is 4. GCS verbal subscore is 5. GCS motor  subscore is 6.  Skin: Skin is warm and dry. Rash noted. No abrasion noted. Rash is urticarial.  Psychiatric: He has a normal mood and affect. His speech is normal and behavior is normal.  Nursing note and vitals reviewed.    ED Treatments / Results  Labs (all labs ordered are listed, but only abnormal results are displayed) Labs Reviewed - No data to display  EKG  EKG Interpretation None       Radiology No results found.  Procedures Procedures (including critical care time)  Medications Ordered in  ED Medications  methylPREDNISolone sodium succinate (SOLU-MEDROL) 125 mg/2 mL injection 125 mg (not administered)  diphenhydrAMINE (BENADRYL) injection 50 mg (not administered)  famotidine (PEPCID) IVPB 20 mg premix (not administered)     Initial Impression / Assessment and Plan / ED Course  I have reviewed the triage vital signs and the nursing notes.  Pertinent labs & imaging results that were available during my care of the patient were reviewed by me and considered in my medical decision making (see chart for details).     Patient treated with Solu-Medrol, Benadryl, Pepcid.  Patient likely is reacting to the sulfur medication he was given.  Will switch patient clindamycin and patient instructed to take Benadryl should he develop any more itching Final Clinical Impressions(s) / ED Diagnoses   Final diagnoses:  None    New Prescriptions New Prescriptions   No medications on file     Lorre NickAllen, Tyree Vandruff, MD 02/06/17 [id number]

## 2017-02-06 NOTE — ED Notes (Signed)
Pt was given an antibiotic Tuesday of this past week for infection on right foot. Pt stated he had no problem with it taking antibiotic 2x per day.

## 2017-02-06 NOTE — ED Triage Notes (Signed)
Pt awoke from sleep and started itching all over around 0330 this morning. Pt denies SOB, Chest pain, and is allergic to shellfish that pt knows of. Pt did not have any shellfish or contact to shellfish last night before bed.

## 2017-02-07 ENCOUNTER — Encounter (HOSPITAL_COMMUNITY): Payer: Self-pay | Admitting: Emergency Medicine

## 2017-02-07 ENCOUNTER — Emergency Department (HOSPITAL_COMMUNITY)
Admission: EM | Admit: 2017-02-07 | Discharge: 2017-02-07 | Disposition: A | Discharge status: Home / Self Care | Payer: Self-pay | Attending: Emergency Medicine | Admitting: Emergency Medicine

## 2017-02-07 DIAGNOSIS — L509 Urticaria, unspecified: Secondary | ICD-10-CM | POA: Insufficient documentation

## 2017-02-07 DIAGNOSIS — Z87891 Personal history of nicotine dependence: Secondary | ICD-10-CM | POA: Insufficient documentation

## 2017-02-07 MED ORDER — DIPHENHYDRAMINE HCL 25 MG PO CAPS: 25.0000 mg | ORAL_CAPSULE | Freq: Four times a day (QID) | ORAL | 0 refills | Status: AC | PRN

## 2017-02-07 MED ORDER — PREDNISONE 10 MG (21) PO TBPK: ORAL_TABLET | ORAL | 0 refills | Status: AC

## 2017-02-07 MED ORDER — FAMOTIDINE 20 MG PO TABS
40.0000 mg | ORAL_TABLET | Freq: Once | ORAL | Status: AC
Start: 2017-02-07 — End: 2017-02-07
  Administered 2017-02-07: 40 mg via ORAL
  Filled 2017-02-07: qty 2

## 2017-02-07 MED ORDER — FAMOTIDINE 20 MG PO TABS: 20.0000 mg | ORAL_TABLET | Freq: Two times a day (BID) | ORAL | 0 refills | Status: DC

## 2017-02-07 MED ORDER — METHYLPREDNISOLONE SODIUM SUCC 125 MG IJ SOLR
125.0000 mg | Freq: Once | INTRAMUSCULAR | Status: AC
  Administered 2017-02-07: 125 mg via INTRAMUSCULAR
  Filled 2017-02-07: qty 2

## 2017-02-07 NOTE — ED Triage Notes (Signed)
Per [facility] from home for itching all over torso that started around 4 am this morning. Patient reports new change in detergent and body soap. Patient given Benadryl 50mg  in route.

## 2017-02-07 NOTE — Discharge Instructions (Signed)
Allergic Reaction Instructions: Stop using the new body wash/soap. Benadryl: Take 25 mg of Benadryl every 6 hours for the next 24 hours.  Use caution as Benadryl can make you drowsy. Pepcid: Take the Pepcid, as prescribed, over the next 3 days. Prednisone: Take prednisone, as prescribed, until finished.  Follow-up with your primary care provider on this matter.  Allergy testing with an allergist may be warranted.  Return to the ED for worsening symptoms, shortness of breath, chest pain, palpitations, persistent vomiting, facial or throat swelling, or any other major concerns.

## 2017-02-07 NOTE — ED Notes (Signed)
Bed: WTR5 Expected date:  Expected time:  Means of arrival:  Comments: 

## 2017-02-07 NOTE — ED Provider Notes (Signed)
[facility]-EMERGENCY DEPT Provider Note   CSN: [id number] Arrival date & time: 02/07/17  [id number]     History   Chief Complaint Chief Complaint  Patient presents with  . Pruritis    HPI Jeremy Horn is a 26 y.o. male.  HPI   Jeremy Horn is a 26 y.o. male, with a history of recent allergic reaction, presenting to the ED with pruritic rash reccurring around 5 AM this morning.  Patient was prescribed Bactrim on October 23 for an abscess.  He was seen in the ED yesterday, October 27, for pruritus and urticarial rash.  His antibiotic was switched to clindamycin and he was treated with Solu-Medrol, Benadryl, and Pepcid. Patient states he has stopped taking the Bactrim.  He did not take any Benadryl at home despite discharge instructions. He has not yet started taking the clindamycin.  Patient received 50 mg of Benadryl via EMS prior to arrival. Patient denies shortness of breath, chest pain, nausea/vomiting, facial or oral swelling, difficulty swallowing, or any other complaints.    History reviewed. No pertinent past medical history.  Patient Active Problem List   Diagnosis Date Noted  . Prediabetes 01/25/2015  . Dyspnea on exertion 01/24/2015  . Chest wall pain 01/24/2015  . Seasonal allergies 01/24/2015  . Acanthosis nigricans 01/24/2015  . Metabolic syndrome 01/24/2015  . Former smoker 01/24/2015    History reviewed. No pertinent surgical history.     Home Medications    Prior to Admission medications   Medication Sig Start Date End Date Taking? Authorizing Provider  clindamycin (CLEOCIN) 150 MG capsule Take 1 capsule (150 mg total) by mouth every 6 (six) hours. 02/06/17   Lorre NickAllen, Anthony, MD  diphenhydrAMINE (BENADRYL) 25 mg capsule Take 1 capsule (25 mg total) by mouth every 6 (six) hours as needed for itching. 02/07/17   Joy, Shawn C, PA-C  famotidine (PEPCID) 20 MG tablet Take 1 tablet (20 mg total) by mouth 2 (two) times daily.  02/07/17 02/10/17  Joy, Shawn C, PA-C  ibuprofen (ADVIL,MOTRIN) 800 MG tablet Take 1 tablet (800 mg total) by mouth every 8 (eight) hours as needed. Patient not taking: Reported on 02/06/2017 09/01/16   Charlestine NightLawyer, Christopher, PA-C  predniSONE (STERAPRED UNI-PAK 21 TAB) 10 MG (21) TBPK tablet Take 6 tabs by mouth daily  for 2 days, then 5 tabs for 2 days, then 4 tabs for 2 days, then 3 tabs for 2 days, 2 tabs for 2 days, then 1 tab by mouth daily for 2 days 02/07/17   Anselm PancoastJoy, Shawn C, PA-C    Family History Family History  Problem Relation Age of Onset  . Diabetes Mother   . Heart disease Father     Social History Social History  Substance Use Topics  . Smoking status: Former Smoker    Packs/day: 0.00    Types: Cigarettes    Quit date: 01/13/2015  . Smokeless tobacco: Never Used  . Alcohol use Yes     Allergies   Shellfish allergy and Sulfa antibiotics   Review of Systems Review of Systems  Constitutional: Negative for chills, diaphoresis and fever.  HENT: Negative for facial swelling, trouble swallowing and voice change.   Respiratory: Negative for cough and shortness of breath.   Cardiovascular: Negative for chest pain and palpitations.  Gastrointestinal: Negative for abdominal pain, diarrhea, nausea and vomiting.  Musculoskeletal: Negative for arthralgias.  Skin: Positive for rash.  Neurological: Negative for dizziness, light-headedness and headaches.  All other systems reviewed and  are negative.    Physical Exam Updated Vital Signs BP 134/81 (BP Location: Right Arm)   Pulse 99   Temp 98.1 F (36.7 C) (Oral)   Resp 18   Ht 6\' 3"  (1.905 m)   Wt 127.7 kg (281 lb 8 oz)   SpO2 97%   BMI 35.19 kg/m   Physical Exam  Constitutional: He is oriented to person, place, and time. He appears well-developed and well-nourished. No distress.  HENT:  Head: Normocephalic and atraumatic.  Eyes: Conjunctivae are normal.  Neck: Neck supple.  Cardiovascular: Normal rate, regular  rhythm, normal heart sounds and intact distal pulses.   Pulmonary/Chest: Effort normal and breath sounds normal. No respiratory distress.  No noted increased work of breathing.  Patient speaks in full sentences without difficulty.  Abdominal: Soft. There is no tenderness. There is no guarding.  Musculoskeletal: He exhibits no edema.  Lymphadenopathy:    He has no cervical adenopathy.  Neurological: He is alert and oriented to person, place, and time.  Skin: Skin is warm and dry. Capillary refill takes less than 2 seconds. Rash noted. Rash is urticarial. He is not diaphoretic.  Psychiatric: He has a normal mood and affect. His behavior is normal.  Nursing note and vitals reviewed.    ED Treatments / Results  Labs (all labs ordered are listed, but only abnormal results are displayed) Labs Reviewed - No data to display  EKG  EKG Interpretation None       Radiology No results found.  Procedures Procedures (including critical care time)  Medications Ordered in ED Medications  methylPREDNISolone sodium succinate (SOLU-MEDROL) 125 mg/2 mL injection 125 mg (not administered)  famotidine (PEPCID) tablet 40 mg (not administered)     Initial Impression / Assessment and Plan / ED Course  I have reviewed the triage vital signs and the nursing notes.  Pertinent labs & imaging results that were available during my care of the patient were reviewed by me and considered in my medical decision making (see chart for details).      Patient presents with pruritic rash.  Urticaria on exam.  Doubt anaphylaxis.  The patient was given very specific instructions for home care as well as return precautions. Patient voices understanding of these instructions, accepts the plan, and is comfortable with discharge.    Final Clinical Impressions(s) / ED Diagnoses   Final diagnoses:  Hives    New Prescriptions New Prescriptions   DIPHENHYDRAMINE (BENADRYL) 25 MG CAPSULE    Take 1 capsule (25  mg total) by mouth every 6 (six) hours as needed for itching.   FAMOTIDINE (PEPCID) 20 MG TABLET    Take 1 tablet (20 mg total) by mouth 2 (two) times daily.   PREDNISONE (STERAPRED UNI-PAK 21 TAB) 10 MG (21) TBPK TABLET    Take 6 tabs by mouth daily  for 2 days, then 5 tabs for 2 days, then 4 tabs for 2 days, then 3 tabs for 2 days, 2 tabs for 2 days, then 1 tab by mouth daily for 2 days     Concepcion Living 02/07/17 8841    Lorre Nick, MD 02/07/17 1510

## 2017-02-09 ENCOUNTER — Encounter (HOSPITAL_COMMUNITY): Payer: Self-pay

## 2017-02-09 DIAGNOSIS — Z87891 Personal history of nicotine dependence: Secondary | ICD-10-CM | POA: Insufficient documentation

## 2017-02-09 DIAGNOSIS — L509 Urticaria, unspecified: Secondary | ICD-10-CM | POA: Insufficient documentation

## 2017-02-09 DIAGNOSIS — Z79899 Other long term (current) drug therapy: Secondary | ICD-10-CM | POA: Insufficient documentation

## 2017-02-09 NOTE — ED Triage Notes (Signed)
[facility] on RLE was started on Bactrim, several days later pt got hives, [facility] for [facility]ives, Bactriim changed to Clindamycin and advised to take benadryl.  Pt did not get Clindamycin filled d/t cost.  Seen in [facility] 02-07-17, was prescribed benadryl, pepcid, and prednisone.  Did not get prednisone d/t cost.  Is taking lpepcid, Benadryl last dose today with no relief. Widespread hives, itcing.  No throat swelling or respiratory difficulties.

## 2017-02-10 ENCOUNTER — Emergency Department (HOSPITAL_COMMUNITY)
Admission: EM | Admit: 2017-02-10 | Discharge: 2017-02-10 | Disposition: A | Discharge status: Home / Self Care | Payer: Self-pay | Attending: Emergency Medicine | Admitting: Emergency Medicine

## 2017-02-10 DIAGNOSIS — L509 Urticaria, unspecified: Secondary | ICD-10-CM

## 2017-02-10 MED ORDER — DEXAMETHASONE SODIUM PHOSPHATE 10 MG/ML IJ SOLN
10.0000 mg | Freq: Once | INTRAMUSCULAR | Status: AC
  Administered 2017-02-10: 10 mg via INTRAMUSCULAR
  Filled 2017-02-10: qty 1

## 2017-02-10 NOTE — ED Provider Notes (Signed)
[facility] EMERGENCY DEPARTMENT Provider Note   CSN: [id number] Arrival date & time: 02/09/17  2242     History   Chief Complaint Chief Complaint  Patient presents with  . Urticaria    HPI Jeremy Horn is a 26 y.o. male.  The history is provided by the patient.  Urticaria  This is a recurrent problem. The current episode started more than 2 days ago. The problem occurs daily. The problem has been gradually worsening. Pertinent negatives include no shortness of breath. Nothing aggravates the symptoms. Relieved by: benadryl.  pt reports continued allergic reaction He reports he was placed on bactrim recently for infection (this has resolved) and soon after developed hives Stopped bactrim However his rash continued despite benadryl He has not been able to afford prednisone  No sob No tongue/lips swelling No other complaints    PMH - none Patient Active Problem List   Diagnosis Date Noted  . Prediabetes 01/25/2015  . Dyspnea on exertion 01/24/2015  . Chest wall pain 01/24/2015  . Seasonal allergies 01/24/2015  . Acanthosis nigricans 01/24/2015  . Metabolic syndrome 01/24/2015  . Former smoker 01/24/2015    History reviewed. No pertinent surgical history.     Home Medications    Prior to Admission medications   Medication Sig Start Date End Date Taking? Authorizing Provider  clindamycin (CLEOCIN) 150 MG capsule Take 1 capsule (150 mg total) by mouth every 6 (six) hours. 02/06/17   Lorre Nick, MD  diphenhydrAMINE (BENADRYL) 25 mg capsule Take 1 capsule (25 mg total) by mouth every 6 (six) hours as needed for itching. 02/07/17   Joy, Shawn C, PA-C  famotidine (PEPCID) 20 MG tablet Take 1 tablet (20 mg total) by mouth 2 (two) times daily. 02/07/17 02/10/17  Joy, Shawn C, PA-C  ibuprofen (ADVIL,MOTRIN) 800 MG tablet Take 1 tablet (800 mg total) by mouth every 8 (eight) hours as needed. Patient not taking: Reported on 02/06/2017 09/01/16    Charlestine Night, PA-C  predniSONE (STERAPRED UNI-PAK 21 TAB) 10 MG (21) TBPK tablet Take 6 tabs by mouth daily  for 2 days, then 5 tabs for 2 days, then 4 tabs for 2 days, then 3 tabs for 2 days, 2 tabs for 2 days, then 1 tab by mouth daily for 2 days 02/07/17   Anselm Pancoast, PA-C    Family History Family History  Problem Relation Age of Onset  . Diabetes Mother   . Heart disease Father     Social History Social History  Substance Use Topics  . Smoking status: Former Smoker    Packs/day: 0.00    Types: Cigarettes    Quit date: 01/13/2015  . Smokeless tobacco: Never Used  . Alcohol use Yes     Allergies   Shellfish allergy and Sulfa antibiotics   Review of Systems Review of Systems  Constitutional: Negative for fever.  HENT: Negative for trouble swallowing.   Respiratory: Negative for shortness of breath.   Gastrointestinal: Negative for vomiting.  Skin: Positive for rash.  All other systems reviewed and are negative.    Physical Exam Updated Vital Signs BP 121/72 (BP Location: Left Arm)   Pulse 88   Temp 97.7 F (36.5 C) (Oral)   Resp 16   Ht 1.905 m (6\' 3" )   Wt 127.5 kg (281 lb)   SpO2 97%   BMI 35.12 kg/m   Physical Exam CONSTITUTIONAL: Well developed/well nourished HEAD: Normocephalic/atraumatic ENMT: Mucous membranes moist, no angioedema NECK: supple no  meningeal signs SPINE/BACK:entire spine nontender CV: S1/S2 noted, no murmurs/rubs/gallops noted LUNGS: Lungs are clear to auscultation bilaterally, no apparent distress ABDOMEN: soft, nontender  NEURO: Pt is awake/alert/appropriate, moves all extremitiesx4.  No facial droop.   EXTREMITIES: pulses normal/equal, full ROM SKIN: warm, color normal, urticaria to right flank/abdomen PSYCH: no abnormalities of mood noted, alert and oriented to situation   ED Treatments / Results  Labs (all labs ordered are listed, but only abnormal results are displayed) Labs Reviewed - No data to display  EKG   EKG Interpretation None       Radiology No results found.  Procedures Procedures (including critical care time)  Medications Ordered in ED Medications  dexamethasone (DECADRON) injection 10 mg (10 mg Intramuscular Given 02/10/17 0210)     Initial Impression / Assessment and Plan / ED Course  I have reviewed the triage vital signs and the nursing notes.   Decadron given If no improvement in 48 hours start prednisone Stable for d/c home   Final Clinical Impressions(s) / ED Diagnoses   Final diagnoses:  Urticaria    New Prescriptions New Prescriptions   No medications on file     Zadie RhineWickline, Abdirahman Chittum, MD 02/10/17 [phone]

## 2017-02-10 NOTE — ED Notes (Signed)
PT states understanding of care given, follow up care. PT ambulated from ED to car with a steady gait.  

## 2017-02-13 ENCOUNTER — Emergency Department (HOSPITAL_COMMUNITY)
Admission: EM | Admit: 2017-02-13 | Discharge: 2017-02-14 | Disposition: A | Discharge status: Home / Self Care | Payer: Self-pay | Attending: Emergency Medicine | Admitting: Emergency Medicine

## 2017-02-13 ENCOUNTER — Encounter (HOSPITAL_COMMUNITY): Payer: Self-pay | Admitting: Emergency Medicine

## 2017-02-13 DIAGNOSIS — Z5189 Encounter for other specified aftercare: Secondary | ICD-10-CM

## 2017-02-13 DIAGNOSIS — Z79899 Other long term (current) drug therapy: Secondary | ICD-10-CM | POA: Insufficient documentation

## 2017-02-13 DIAGNOSIS — Z87891 Personal history of nicotine dependence: Secondary | ICD-10-CM | POA: Insufficient documentation

## 2017-02-13 DIAGNOSIS — F419 Anxiety disorder, unspecified: Secondary | ICD-10-CM | POA: Insufficient documentation

## 2017-02-13 DIAGNOSIS — L989 Disorder of the skin and subcutaneous tissue, unspecified: Secondary | ICD-10-CM | POA: Insufficient documentation

## 2017-02-13 NOTE — ED Triage Notes (Signed)
Pt reports having feeling like it is hard to take a deep breath an throat feels tight. No acute distress at this time. Pt has equal chest rise and fall. Also reports wound to right forearm and was seen on 02/10/17 for same.

## 2017-02-14 MED ORDER — CEPHALEXIN 500 MG PO CAPS
500.0000 mg | ORAL_CAPSULE | Freq: Once | ORAL | Status: AC
Start: 2017-02-14 — End: 2017-02-14
  Administered 2017-02-14: 500 mg via ORAL
  Filled 2017-02-14: qty 1

## 2017-02-14 MED ORDER — HYDROXYZINE HCL 25 MG PO TABS: 25.0000 mg | ORAL_TABLET | Freq: Four times a day (QID) | ORAL | 0 refills | Status: DC | PRN

## 2017-02-14 MED ORDER — HYDROXYZINE HCL 25 MG PO TABS
25.0000 mg | ORAL_TABLET | Freq: Once | ORAL | Status: AC
  Administered 2017-02-14: 25 mg via ORAL
  Filled 2017-02-14: qty 1

## 2017-02-14 MED ORDER — CEPHALEXIN 500 MG PO CAPS: 500.0000 mg | ORAL_CAPSULE | Freq: Four times a day (QID) | ORAL | 0 refills | Status: AC

## 2017-02-14 NOTE — Discharge Instructions (Signed)
Wash the affected area with soap and water and apply a thin layer of topical antibiotic ointment. Do this every 12 hours.   Do not use rubbing alcohol or hydrogen peroxide.                        Look for signs of infection: if you see redness, if the area becomes warm, if pain increases sharply, there is discharge (pus), if red streaks appear or you develop fever or vomiting, RETURN immediately to the Emergency Department  for a recheck.   Do not hesitate to return to the emergency room for any new, worsening or concerning symptoms.  Please obtain primary care using resource guide below. Let them know that you were seen in the emergency room and that they will need to obtain records for further outpatient management.
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
## 2017-03-12 ENCOUNTER — Encounter (HOSPITAL_COMMUNITY): Payer: Self-pay | Admitting: Emergency Medicine

## 2017-03-12 ENCOUNTER — Emergency Department (HOSPITAL_COMMUNITY): Payer: Self-pay

## 2017-03-12 DIAGNOSIS — R079 Chest pain, unspecified: Secondary | ICD-10-CM | POA: Insufficient documentation

## 2017-03-12 DIAGNOSIS — Z5321 Procedure and treatment not carried out due to patient leaving prior to being seen by health care provider: Secondary | ICD-10-CM | POA: Insufficient documentation

## 2017-03-12 LAB — CBC
HCT: 43 % (ref 39.0–52.0)
Hemoglobin: 14.4 g/dL (ref 13.0–17.0)
MCH: 27 pg (ref 26.0–34.0)
MCHC: 33.5 g/dL (ref 30.0–36.0)
MCV: 80.7 fL (ref 78.0–100.0)
PLATELETS: 243 10*3/uL (ref 150–400)
RBC: 5.33 MIL/uL (ref 4.22–5.81)
RDW: 14.1 % (ref 11.5–15.5)
WBC: 11.1 10*3/uL — AB (ref 4.0–10.5)

## 2017-03-12 LAB — BASIC METABOLIC PANEL
Anion gap: 5 (ref 5–15)
BUN: 8 mg/dL (ref 6–20)
CO2: 25 mmol/L (ref 22–32)
CREATININE: 0.87 mg/dL (ref 0.61–1.24)
Calcium: 8.7 mg/dL — ABNORMAL LOW (ref 8.9–10.3)
Chloride: 109 mmol/L (ref 101–111)
GFR calc Af Amer: >60 mL/min (ref >60)
GLUCOSE: 102 mg/dL — AB (ref 65–99)
Potassium: 3.6 mmol/L (ref 3.5–5.1)
SODIUM: 139 mmol/L (ref 135–145)

## 2017-03-12 LAB — I-STAT TROPONIN, ED: Troponin i, poc: 0.01 ng/mL (ref 0.00–0.08)

## 2017-03-12 NOTE — ED Triage Notes (Signed)
Reports central chest pressure when taking a deep breath since yesterday.  Denies having any other symptoms.

## 2017-03-13 ENCOUNTER — Emergency Department (HOSPITAL_COMMUNITY)
Admission: EM | Admit: 2017-03-13 | Discharge: 2017-03-13 | Disposition: A | Discharge status: Home / Self Care | Payer: Self-pay | Attending: Emergency Medicine | Admitting: Emergency Medicine

## 2017-03-13 ENCOUNTER — Other Ambulatory Visit: Payer: Self-pay

## 2017-03-13 ENCOUNTER — Encounter (HOSPITAL_COMMUNITY): Payer: Self-pay | Admitting: *Deleted

## 2017-03-13 DIAGNOSIS — R12 Heartburn: Secondary | ICD-10-CM | POA: Insufficient documentation

## 2017-03-13 DIAGNOSIS — R0789 Other chest pain: Secondary | ICD-10-CM | POA: Insufficient documentation

## 2017-03-13 DIAGNOSIS — Z79899 Other long term (current) drug therapy: Secondary | ICD-10-CM | POA: Insufficient documentation

## 2017-03-13 DIAGNOSIS — R079 Chest pain, unspecified: Secondary | ICD-10-CM

## 2017-03-13 DIAGNOSIS — Z87891 Personal history of nicotine dependence: Secondary | ICD-10-CM | POA: Insufficient documentation

## 2017-03-13 MED ORDER — GI COCKTAIL ~~LOC~~
30.0000 mL | Freq: Once | ORAL | Status: AC
  Administered 2017-03-13: 30 mL via ORAL
  Filled 2017-03-13: qty 30

## 2017-03-13 MED ORDER — FAMOTIDINE 20 MG PO TABS: 20.0000 mg | ORAL_TABLET | Freq: Two times a day (BID) | ORAL | 0 refills | Status: AC

## 2017-03-13 NOTE — ED Notes (Signed)
Pt was called to room x2, no answer.

## 2017-03-13 NOTE — ED Notes (Signed)
Pt was called to room, no answer. 

## 2017-03-13 NOTE — ED Triage Notes (Signed)
The pt is c/o chest pain for 3 days  His pain is worse with inspiration  Cough  He was here  Yesterday but left without being seen

## 2017-03-13 NOTE — ED Provider Notes (Signed)
[facility] Provider [facility]   CSN: [id number] Arrival date & time: 03/13/17  2025     History   Chief Complaint Chief Complaint  Patient presents with  . Chest Pain    HPI Jeremy Horn is a 26 y.o. male with PMHx anxiety, presenting to ED with 3 days of intermittent chest discomfort that occurs when he takes a deep breath and following a meal. States it is not a pain. Symptoms on occur a couple times per day. Reports assoc anxiety. No assoc SOB, N, diaphoresis, cough, F, or other complaints. No recent prolonged travel, no recent trauma or surgery. No hx DVT or PE.   The history is provided by the patient.    History reviewed. No pertinent past medical history.  Patient Active Problem List   Diagnosis Date Noted  . Prediabetes 01/25/2015  . Dyspnea on exertion 01/24/2015  . Chest wall pain 01/24/2015  . Seasonal allergies 01/24/2015  . Acanthosis nigricans 01/24/2015  . Metabolic syndrome 01/24/2015  . Former smoker 01/24/2015    History reviewed. No pertinent surgical history.     Home Medications    Prior to Admission medications   Medication Sig Start Date End Date Taking? Authorizing Provider  cephALEXin (KEFLEX) 500 MG capsule Take 1 capsule (500 mg total) by mouth 4 (four) times daily. 02/14/17   Pisciotta, Joni ReiningNicole, PA-C  clindamycin (CLEOCIN) 150 MG capsule Take 1 capsule (150 mg total) by mouth every 6 (six) hours. 02/06/17   Lorre NickAllen, Anthony, MD  diphenhydrAMINE (BENADRYL) 25 mg capsule Take 1 capsule (25 mg total) by mouth every 6 (six) hours as needed for itching. 02/07/17   Joy, Shawn C, PA-C  famotidine (PEPCID) 20 MG tablet Take 1 tablet (20 mg total) by mouth 2 (two) times daily. 03/13/17   Tuesday Terlecki, [place] N, PA-C  hydrOXYzine (ATARAX/VISTARIL) 25 MG tablet Take 1 tablet (25 mg total) by mouth every 6 (six) hours as needed for anxiety. 02/14/17   Pisciotta, Joni ReiningNicole, PA-C  ibuprofen (ADVIL,MOTRIN) 800 MG tablet Take 1  tablet (800 mg total) by mouth every 8 (eight) hours as needed. Patient not taking: Reported on 02/06/2017 09/01/16   Charlestine NightLawyer, Christopher, PA-C  predniSONE (STERAPRED UNI-PAK 21 TAB) 10 MG (21) TBPK tablet Take 6 tabs by mouth daily  for 2 days, then 5 tabs for 2 days, then 4 tabs for 2 days, then 3 tabs for 2 days, 2 tabs for 2 days, then 1 tab by mouth daily for 2 days 02/07/17   Anselm PancoastJoy, Shawn C, PA-C    Family History Family History  Problem Relation Age of Onset  . Diabetes Mother   . Heart disease Father     Social History Social History   Tobacco Use  . Smoking status: Former Smoker    Packs/day: 0.00    Types: Cigarettes    Last attempt to quit: 01/13/2015    Years since quitting: 2.1  . Smokeless tobacco: Never Used  Substance Use Topics  . Alcohol use: Yes  . Drug use: Yes    Types: Marijuana     Allergies   Shellfish allergy and Sulfa antibiotics   Review of Systems Review of Systems  Constitutional: Negative for chills, diaphoresis and fever.  HENT: Negative for congestion.   Respiratory: Negative for cough and shortness of breath.   Cardiovascular: Positive for chest pain. Negative for palpitations.  Gastrointestinal: Negative for nausea.  All other systems reviewed and are negative.    Physical Exam Updated  Vital Signs BP 123/72   Pulse 81   Temp 99 F (37.2 C) (Oral)   Resp 19   Ht 6\' 3"  (1.905 m)   Wt 126.6 kg (279 lb)   SpO2 100%   BMI 34.87 kg/m   Physical Exam  Constitutional: He appears well-developed and well-nourished. He does not appear ill. No distress.  HENT:  Head: Normocephalic and atraumatic.  Mouth/Throat: Oropharynx is clear and moist.  Eyes: Conjunctivae are normal.  Neck: Normal range of motion. Neck supple.  Cardiovascular: Normal rate, regular rhythm, normal heart sounds, intact distal pulses and normal pulses.  Pulmonary/Chest: Effort normal and breath sounds normal. No respiratory distress. He exhibits no tenderness.    Abdominal: Soft.  Musculoskeletal:       Right lower leg: He exhibits no edema.       Left lower leg: He exhibits no edema.  Neurological: He is alert.  Skin: Skin is warm.  Psychiatric: He has a normal mood and affect. His behavior is normal.  Nursing note and vitals reviewed.    ED Treatments / Results  Labs (all labs ordered are listed, but only abnormal results are displayed) Labs Reviewed - No data to display  EKG  EKG Interpretation None       Radiology Dg Chest 2 View  Result Date: 03/12/2017 CLINICAL DATA:  Chest pressure. EXAM: CHEST  2 VIEW COMPARISON:  January 19, 2015 FINDINGS: The heart, hila, and mediastinum are normal. No pulmonary nodules or masses. No focal infiltrates. IMPRESSION: No active cardiopulmonary disease. Electronically Signed   By: Gerome Samavid  Williams III M.D   On: 03/12/2017 21:59    Procedures Procedures (including critical care time)  Medications Ordered in ED Medications  gi cocktail (Maalox,Lidocaine,Donnatal) (30 mLs Oral Given 03/13/17 2138)     Initial Impression / Assessment and Plan / ED Course  I have reviewed the triage vital signs and the nursing notes.  Pertinent labs & imaging results that were available during my care of the patient were reviewed by me and considered in my medical decision making (see chart for details).     Pt with intermittent post-prandial chest discomfort x 3 days. Chest pain is not likely of cardiac or pulmonary etiology d/t presentation, CP is not exertional, not assoc with nausea or diaphoresis, PERC negative, VSS, no tracheal deviation, no JVD or new murmur, RRR, breath sounds equal bilaterally, EKG without acute abnormalities, and negative CXR. Pt symptoms are post-prandial, and likely related to acid-reflux. GI cocktail given in ED and patient reports "it helped a lot." Will discharge with rx for pepcid and recommendation to follow up with PCP regarding today's visit. Pt has been advised to return to the  ED if CP becomes exertional, associated with diaphoresis or nausea, radiates to left jaw/arm, worsens or becomes concerning in any way. Pt appears reliable for follow up and is agreeable to discharge.   Case has been discussed with Dr. Ranae PalmsYelverton who agrees with the above plan to discharge.   Discussed results, findings, treatment and follow up. Patient advised of return precautions. Patient verbalized understanding and agreed with plan.  Final Clinical Impressions(s) / ED Diagnoses   Final diagnoses:  Heartburn  Nonspecific chest pain    ED Discharge Orders        Ordered    famotidine (PEPCID) 20 MG tablet  2 times daily     03/13/17 2224       Skilynn Durney, [place] N, PA-C 03/13/17 2225    Loren RacerYelverton, David, MD  03/23/17 1221  

## 2017-03-13 NOTE — Discharge Instructions (Signed)
Please read instructions below. You can take pepcid every 12 hours as needed for chest discomfort.  Return to the ER for new or worsening symptoms; including worsening chest pain, shortness of breath, pain that radiates to the arm or neck, pain or shortness of breath worsened with exertion. Follow up with your primary care provider regarding your visit today.

## 2017-03-13 NOTE — ED Notes (Signed)
No response in lobby when being called to room.

## 2017-03-17 ENCOUNTER — Encounter (HOSPITAL_COMMUNITY): Payer: Self-pay | Admitting: Emergency Medicine

## 2017-03-17 ENCOUNTER — Emergency Department (HOSPITAL_COMMUNITY)
Admission: EM | Admit: 2017-03-17 | Discharge: 2017-03-17 | Disposition: A | Discharge status: Home / Self Care | Payer: Self-pay | Attending: Emergency Medicine | Admitting: Emergency Medicine

## 2017-03-17 DIAGNOSIS — Z87891 Personal history of nicotine dependence: Secondary | ICD-10-CM | POA: Insufficient documentation

## 2017-03-17 DIAGNOSIS — R197 Diarrhea, unspecified: Secondary | ICD-10-CM | POA: Insufficient documentation

## 2017-03-17 DIAGNOSIS — R112 Nausea with vomiting, unspecified: Secondary | ICD-10-CM | POA: Insufficient documentation

## 2017-03-17 HISTORY — DX: Anxiety disorder, unspecified: F41.9

## 2017-03-17 LAB — I-STAT CHEM 8, ED
BUN: 9 mg/dL (ref 6–20)
CHLORIDE: 105 mmol/L (ref 101–111)
Calcium, Ion: 1.18 mmol/L (ref 1.15–1.40)
Creatinine, Ser: 0.7 mg/dL (ref 0.61–1.24)
Glucose, Bld: 114 mg/dL — ABNORMAL HIGH (ref 65–99)
HEMATOCRIT: 42 % (ref 39.0–52.0)
Hemoglobin: 14.3 g/dL (ref 13.0–17.0)
POTASSIUM: 3.1 mmol/L — AB (ref 3.5–5.1)
SODIUM: 141 mmol/L (ref 135–145)
TCO2: 21 mmol/L — ABNORMAL LOW (ref 22–32)

## 2017-03-17 MED ORDER — KETOROLAC TROMETHAMINE 15 MG/ML IJ SOLN
15.0000 mg | Freq: Once | INTRAMUSCULAR | Status: AC
  Administered 2017-03-17: 15 mg via INTRAVENOUS
  Filled 2017-03-17: qty 1

## 2017-03-17 MED ORDER — ONDANSETRON HCL 4 MG/2ML IJ SOLN
4.0000 mg | Freq: Once | INTRAMUSCULAR | Status: AC
  Administered 2017-03-17: 4 mg via INTRAVENOUS
  Filled 2017-03-17: qty 2

## 2017-03-17 MED ORDER — ONDANSETRON 8 MG PO TBDP: 8.0000 mg | ORAL_TABLET | Freq: Three times a day (TID) | ORAL | 0 refills | Status: DC | PRN

## 2017-03-17 MED ORDER — SODIUM CHLORIDE 0.9 % IV BOLUS (SEPSIS)
1000.0000 mL | Freq: Once | INTRAVENOUS | Status: AC
  Administered 2017-03-17: 1000 mL via INTRAVENOUS
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
## 2017-03-17 NOTE — ED Provider Notes (Signed)
[facility]-EMERGENC[facility] DEPT Provider Note   CSN: [id number] Arrival date & time: 03/17/17  1113     History   Chief Complaint Chief Complaint  Patient presents with  . Nausea  . Emesis  . Cough  . Diarrhea  . Abdominal Pain    HPI Jeremy Horn is a 26 y.o. male.  HPI Patient develops nausea vomiting diarrhea over the past 12 hours.  No blood in his vomit.  No blood in his diarrhea.  No fevers or chills.  Reports crampy abdominal pain.  No other complaints.  Multiple recent sick contacts as his children had similar symptoms.   Past Medical History:  Diagnosis Date  . Anxiety     Patient Active Problem List   Diagnosis Date Noted  . Prediabetes 01/25/2015  . Dyspnea on exertion 01/24/2015  . Chest wall pain 01/24/2015  . Seasonal allergies 01/24/2015  . Acanthosis nigricans 01/24/2015  . Metabolic syndrome 01/24/2015  . Former smoker 01/24/2015    History reviewed. No pertinent surgical history.     Home Medications    Prior to Admission medications   Medication Sig Start Date End Date Taking? Authorizing Provider  diphenhydrAMINE (BENADRYL) 25 mg capsule Take 1 capsule (25 mg total) by mouth every 6 (six) hours as needed for itching. 02/07/17  Yes Joy, Shawn C, PA-C  famotidine (PEPCID) 20 MG tablet Take 1 tablet (20 mg total) by mouth 2 (two) times daily. 03/13/17  Yes Robinson, [place] N, PA-C  hydrOXYzine (ATARAX/VISTARIL) 25 MG tablet Take 1 tablet (25 mg total) by mouth every 6 (six) hours as needed for anxiety. 02/14/17  Yes Pisciotta, Joni ReiningNicole, PA-C  ibuprofen (ADVIL,MOTRIN) 800 MG tablet Take 1 tablet (800 mg total) by mouth every 8 (eight) hours as needed. 09/01/16  Yes Lawyer, Cristal Deerhristopher, PA-C  cephALEXin (KEFLEX) 500 MG capsule Take 1 capsule (500 mg total) by mouth 4 (four) times daily. Patient not taking: Reported on 03/17/2017 02/14/17   Pisciotta, Joni ReiningNicole, PA-C  clindamycin (CLEOCIN) 150 MG capsule Take 1 capsule (150 mg total)  by mouth every 6 (six) hours. Patient not taking: Reported on 03/17/2017 02/06/17   Lorre NickAllen, Anthony, MD  ondansetron (ZOFRAN ODT) 8 MG disintegrating tablet Take 1 tablet (8 mg total) by mouth every 8 (eight) hours as needed for nausea or vomiting. 03/17/17   Azalia Bilisampos, Talesha Ellithorpe, MD  predniSONE (STERAPRED UNI-PAK 21 TAB) 10 MG (21) TBPK tablet Take 6 tabs by mouth daily  for 2 days, then 5 tabs for 2 days, then 4 tabs for 2 days, then 3 tabs for 2 days, 2 tabs for 2 days, then 1 tab by mouth daily for 2 days Patient not taking: Reported on 03/17/2017 02/07/17   Anselm PancoastJoy, Shawn C, PA-C    Family History Family History  Problem Relation Age of Onset  . Diabetes Mother   . Heart disease Father     Social History Social History   Tobacco Use  . Smoking status: Former Smoker    Packs/day: 0.00    Types: Cigarettes    Last attempt to quit: 01/13/2015    Years since quitting: 2.1  . Smokeless tobacco: Never Used  Substance Use Topics  . Alcohol use: Yes  . Drug use: Yes    Types: Marijuana    Comment: last 12/4     Allergies   Shellfish allergy and Sulfa antibiotics   Review of Systems Review of Systems  All other systems reviewed and are negative.    Physical Exam  Updated Vital Signs BP (!) 95/47 (BP Location: Right Arm)   Pulse 95   Temp 98.5 F (36.9 C) (Oral)   Resp 15   SpO2 100%   Physical Exam  Constitutional: He is oriented to person, place, and time. He appears well-developed and well-nourished.  HENT:  Head: Normocephalic and atraumatic.  Eyes: EOM are normal.  Neck: Normal range of motion.  Cardiovascular: Normal rate, regular rhythm, normal heart sounds and intact distal pulses.  Pulmonary/Chest: Effort normal and breath sounds normal. No respiratory distress.  Abdominal: Soft. He exhibits no distension. There is no tenderness.  Musculoskeletal: Normal range of motion.  Neurological: He is alert and oriented to person, place, and time.  Skin: Skin is warm and dry.    Psychiatric: He has a normal mood and affect. Judgment normal.  Nursing note and vitals reviewed.    ED Treatments / Results  Labs (all labs ordered are listed, but only abnormal results are displayed) Labs Reviewed  I-STAT CHEM 8, ED - Abnormal; Notable for the following components:      Result Value   Potassium 3.1 (*)    Glucose, Bld 114 (*)    TCO2 21 (*)    All other components within normal limits    EKG  EKG Interpretation None       Radiology No results found.  Procedures Procedures (including critical care time)  Medications Ordered in ED Medications  sodium chloride 0.9 % bolus 1,000 mL (0 mLs Intravenous Stopped 03/17/17 1527)  ondansetron (ZOFRAN) injection 4 mg (4 mg Intravenous Given 03/17/17 1230)  ketorolac (TORADOL) 15 MG/ML injection 15 mg (15 mg Intravenous Given 03/17/17 1230)     Initial Impression / Assessment and Plan / ED Course  I have reviewed the triage vital signs and the nursing notes.  Pertinent labs & imaging results that were available during my care of the patient were reviewed by me and considered in my medical decision making (see chart for details).     Improvement in symptoms.  Tolerating oral fluids.  Discharged home in good condition.  Suspect viral process.  Final Clinical Impressions(s) / ED Diagnoses   Final diagnoses:  Nausea vomiting and diarrhea    ED Discharge Orders        Ordered    ondansetron (ZOFRAN ODT) 8 MG disintegrating tablet  Every 8 hours PRN     03/17/17 1633       Azalia Bilisampos, Bodee Lafoe, MD 03/17/17 1651

## 2017-03-17 NOTE — ED Triage Notes (Signed)
Pt reports c/o acute flu symptoms that started this morning. C/o nausea, vomiting x 3, diarrhea x 3. Reports cough.Pt c/o severe low abdominal pain. Pt prefers sitting on floor and assisted to stretcher x 2. Unable to obtain BP , pt constantly flexing arms and c/o pain.No cough or vomiting during this assessment. Pt requested cold water and is tolerating without emesis.

## 2017-04-26 ENCOUNTER — Emergency Department (HOSPITAL_COMMUNITY)
Admission: EM | Admit: 2017-04-26 | Discharge: 2017-04-26 | Disposition: A | Discharge status: Home / Self Care | Payer: Self-pay | Attending: Emergency Medicine | Admitting: Emergency Medicine

## 2017-04-26 ENCOUNTER — Encounter (HOSPITAL_COMMUNITY): Payer: Self-pay | Admitting: Emergency Medicine

## 2017-04-26 ENCOUNTER — Other Ambulatory Visit: Payer: Self-pay

## 2017-04-26 DIAGNOSIS — J3489 Other specified disorders of nose and nasal sinuses: Secondary | ICD-10-CM | POA: Insufficient documentation

## 2017-04-26 DIAGNOSIS — Z87891 Personal history of nicotine dependence: Secondary | ICD-10-CM | POA: Insufficient documentation

## 2017-04-26 DIAGNOSIS — R110 Nausea: Secondary | ICD-10-CM | POA: Insufficient documentation

## 2017-04-26 DIAGNOSIS — Z79899 Other long term (current) drug therapy: Secondary | ICD-10-CM | POA: Insufficient documentation

## 2017-04-26 DIAGNOSIS — B309 Viral conjunctivitis, unspecified: Secondary | ICD-10-CM | POA: Insufficient documentation

## 2017-04-26 DIAGNOSIS — R0981 Nasal congestion: Secondary | ICD-10-CM | POA: Insufficient documentation

## 2017-04-26 DIAGNOSIS — J020 Streptococcal pharyngitis: Secondary | ICD-10-CM | POA: Insufficient documentation

## 2017-04-26 DIAGNOSIS — R197 Diarrhea, unspecified: Secondary | ICD-10-CM | POA: Insufficient documentation

## 2017-04-26 DIAGNOSIS — Z209 Contact with and (suspected) exposure to unspecified communicable disease: Secondary | ICD-10-CM | POA: Insufficient documentation

## 2017-04-26 LAB — BASIC METABOLIC PANEL
Anion gap: 7 (ref 5–15)
BUN: 8 mg/dL (ref 6–20)
CHLORIDE: 104 mmol/L (ref 101–111)
CO2: 25 mmol/L (ref 22–32)
CREATININE: 0.98 mg/dL (ref 0.61–1.24)
Calcium: 8.9 mg/dL (ref 8.9–10.3)
Glucose, Bld: 125 mg/dL — ABNORMAL HIGH (ref 65–99)
Potassium: 3.4 mmol/L — ABNORMAL LOW (ref 3.5–5.1)
SODIUM: 136 mmol/L (ref 135–145)

## 2017-04-26 LAB — CBC WITH DIFFERENTIAL/PLATELET
BASOS ABS: 0 10*3/uL (ref 0.0–0.1)
Basophils Relative: 0 %
EOS ABS: 0 10*3/uL (ref 0.0–0.7)
EOS PCT: 0 %
HCT: 45 % (ref 39.0–52.0)
HEMOGLOBIN: 15.4 g/dL (ref 13.0–17.0)
LYMPHS ABS: 1.2 10*3/uL (ref 0.7–4.0)
Lymphocytes Relative: 11 %
MCH: 26.8 pg (ref 26.0–34.0)
MCHC: 34.2 g/dL (ref 30.0–36.0)
MCV: 78.4 fL (ref 78.0–100.0)
Monocytes Absolute: 1 10*3/uL (ref 0.1–1.0)
Monocytes Relative: 10 %
NEUTROS PCT: 79 %
Neutro Abs: 8.4 10*3/uL — ABNORMAL HIGH (ref 1.7–7.7)
PLATELETS: 224 10*3/uL (ref 150–400)
RBC: 5.74 MIL/uL (ref 4.22–5.81)
RDW: 13.9 % (ref 11.5–15.5)
WBC: 10.6 10*3/uL — AB (ref 4.0–10.5)

## 2017-04-26 LAB — RAPID STREP SCREEN (MED CTR MEBANE ONLY): STREPTOCOCCUS, GROUP A SCREEN (DIRECT): POSITIVE — AB

## 2017-04-26 MED ORDER — LIDOCAINE VISCOUS 2 % MT SOLN: 15.0000 mL | OROMUCOSAL | 0 refills | Status: AC | PRN

## 2017-04-26 MED ORDER — DEXAMETHASONE SODIUM PHOSPHATE 10 MG/ML IJ SOLN
10.0000 mg | Freq: Once | INTRAMUSCULAR | Status: AC
  Administered 2017-04-26: 10 mg via INTRAMUSCULAR
  Filled 2017-04-26: qty 1

## 2017-04-26 MED ORDER — NAPHAZOLINE-PHENIRAMINE 0.025-0.3 % OP SOLN: 1.0000 [drp] | Freq: Four times a day (QID) | OPHTHALMIC | 0 refills | Status: AC | PRN

## 2017-04-26 MED ORDER — ONDANSETRON 4 MG PO TBDP: 4.0000 mg | ORAL_TABLET | Freq: Three times a day (TID) | ORAL | 0 refills | Status: DC | PRN

## 2017-04-26 MED ORDER — ERYTHROMYCIN 5 MG/GM OP OINT: TOPICAL_OINTMENT | OPHTHALMIC | 0 refills | Status: AC

## 2017-04-26 MED ORDER — AMOXICILLIN 500 MG PO CAPS: 1000.0000 mg | ORAL_CAPSULE | Freq: Every day | ORAL | 0 refills | Status: AC

## 2017-04-26 NOTE — Discharge Instructions (Signed)
Please read attached information regarding your condition. Complete your entire course of amoxicillin for 10 days regardless of symptom improvement. Use lidocaine solution to swish and spit as needed for throat discomfort. Take Zofran as needed for nausea and vomiting. Use Naphcon-A eyedrops and antibiotic ointment in affected eye. Take Tylenol or ibuprofen as needed for pain and fevers. Slowly advance your diet but be sure to push fluids to maintain adequate hydration. Return to ED for worsening sore throat, trouble breathing, trouble swallowing, chest pain, shortness of breath, lightheadedness or loss of consciousness.

## 2017-04-26 NOTE — ED Notes (Signed)
Pt is alert and orinted x 4 and is verbally responsive. Pt reports having a sore throat 9/10 pain and rt eye redness.

## 2017-04-26 NOTE — ED Provider Notes (Signed)
Patient placed in Quick Look pathway, seen and evaluated.  Chief Complaint: flu like symptoms  HPI:   Since 2 days reports sore throat, diarrhea, nausea and vomiting x2, subjective fever, congestion  ROS: sore throat  Physical Exam:   Gen: No distress  Neuro: Awake and Alert  Skin: Warm  Focused Exam: tonsillar edema bilaterally and symmetrically with no exudates, tolerating secretions with no trismus or drooling, no neck pain, afebrile; TMs clear; rhinorrhea noted; lungs CTAB, RRR; no abdominal TTP   Initiation of care has begun. The patient has been counseled on the process, plan, and necessity for staying for the completion/evaluation, and the remainder of the medical screening examination    Dietrich PatesKhatri, Stewart Sasaki, PA-C 04/26/17 1529    Pricilla LovelessGoldston, Scott, MD 04/26/17 1806

## 2017-04-26 NOTE — ED Provider Notes (Signed)
[name] Provider Note   CSN: [id number] Arrival date & time: 04/26/17  1706     History   Chief Complaint Chief Complaint  Patient presents with  . Sore Throat  . Emesis  . Diarrhea    HPI Jeremy Horn is a 27 y.o. male with a past medical history of anxiety, who presents to ED for evaluation of 2-day history of sore throat, subjective fever, nasal congestion.  He also reports 2 episodes of diarrhea and ongoing feelings of nausea.  Sick contacts with son with similar symptoms last week.  He has not taking any medications to help with symptoms.  He denies any abdominal pain, chest pain, shortness of breath, trouble breathing, trouble swallowing, neck pain, urinary symptoms, hematochezia, melena, hematemesis. He also reports 2-day history of right eye redness, purulent discharge upon waking up 2 days ago.  No changes in vision, foreign body sensation, injury or trauma to eye, pain with EOMs.  HPI  Past Medical History:  Diagnosis Date  . Anxiety     Patient Active Problem List   Diagnosis Date Noted  . Prediabetes 01/25/2015  . Dyspnea on exertion 01/24/2015  . Chest wall pain 01/24/2015  . Seasonal allergies 01/24/2015  . Acanthosis nigricans 01/24/2015  . Metabolic syndrome 01/24/2015  . Former smoker 01/24/2015    History reviewed. No pertinent surgical history.     Home Medications    Prior to Admission medications   Medication Sig Start Date End Date Taking? Authorizing Provider  amoxicillin (AMOXIL) 500 MG capsule Take 2 capsules (1,000 mg total) by mouth daily for 10 doses. 04/26/17 05/06/17  Shandrea Lusk, PA-C  cephALEXin (KEFLEX) 500 MG capsule Take 1 capsule (500 mg total) by mouth 4 (four) times daily. Patient not taking: Reported on 03/17/2017 02/14/17   Pisciotta, Joni Reining, PA-C  clindamycin (CLEOCIN) 150 MG capsule Take 1 capsule (150 mg total) by mouth every 6 (six) hours. Patient not taking: Reported on 03/17/2017  02/06/17   Lorre Nick, MD  diphenhydrAMINE (BENADRYL) 25 mg capsule Take 1 capsule (25 mg total) by mouth every 6 (six) hours as needed for itching. 02/07/17   Joy, Shawn C, PA-C  erythromycin ophthalmic ointment Place a 1/2 inch ribbon of ointment into the lower eyelid. 04/26/17   Dorathy Stallone, PA-C  famotidine (PEPCID) 20 MG tablet Take 1 tablet (20 mg total) by mouth 2 (two) times daily. 03/13/17   Robinson, [place] N, PA-C  hydrOXYzine (ATARAX/VISTARIL) 25 MG tablet Take 1 tablet (25 mg total) by mouth every 6 (six) hours as needed for anxiety. 02/14/17   Pisciotta, Joni Reining, PA-C  ibuprofen (ADVIL,MOTRIN) 800 MG tablet Take 1 tablet (800 mg total) by mouth every 8 (eight) hours as needed. 09/01/16   Lawyer, Cristal Deer, PA-C  lidocaine (XYLOCAINE) 2 % solution Use as directed 15 mLs in the mouth or throat as needed for mouth pain. 04/26/17   Matteus Mcnelly, PA-C  naphazoline-pheniramine (NAPHCON-A) 0.025-0.3 % ophthalmic solution Place 1 drop into the right eye 4 (four) times daily as needed for eye irritation. 04/26/17   Ryin Ambrosius, PA-C  ondansetron (ZOFRAN ODT) 4 MG disintegrating tablet Take 1 tablet (4 mg total) by mouth every 8 (eight) hours as needed for nausea or vomiting. 04/26/17   Salima Rumer, PA-C  predniSONE (STERAPRED UNI-PAK 21 TAB) 10 MG (21) TBPK tablet Take 6 tabs by mouth daily  for 2 days, then 5 tabs for 2 days, then 4 tabs for 2 days, then 3 tabs for  2 days, 2 tabs for 2 days, then 1 tab by mouth daily for 2 days Patient not taking: Reported on 03/17/2017 02/07/17   Anselm Pancoast, PA-C    Family History Family History  Problem Relation Age of Onset  . Diabetes Mother   . Heart disease Father     Social History Social History   Tobacco Use  . Smoking status: Former Smoker    Packs/day: 0.00    Types: Cigarettes    Last attempt to quit: 01/13/2015    Years since quitting: 2.2  . Smokeless tobacco: Never Used  Substance Use Topics  . Alcohol use: Yes  . Drug use: Yes      Types: Marijuana    Comment: last 12/4     Allergies   Shellfish allergy and Sulfa antibiotics   Review of Systems Review of Systems  Constitutional: Positive for appetite change and chills. Negative for fever.  HENT: Positive for congestion and sore throat. Negative for ear pain, facial swelling, postnasal drip, rhinorrhea, sneezing and voice change.   Eyes: Negative for photophobia and visual disturbance.  Respiratory: Positive for cough. Negative for chest tightness, shortness of breath and wheezing.   Cardiovascular: Negative for chest pain and palpitations.  Gastrointestinal: Positive for diarrhea and vomiting. Negative for abdominal pain, blood in stool, constipation and nausea.  Genitourinary: Negative for dysuria, hematuria and urgency.  Musculoskeletal: Negative for myalgias.  Skin: Negative for rash.  Neurological: Negative for dizziness, weakness and light-headedness.     Physical Exam Updated Vital Signs BP 125/78 (BP Location: Right Arm)   Pulse 98   Temp 98.4 F (36.9 C) (Oral)   Resp 20   Ht 6\' 3"  (1.905 m)   Wt 127 kg (280 lb)   SpO2 98%   BMI 35.00 kg/m   Physical Exam  Constitutional: He appears well-developed and well-nourished. No distress.  Nontoxic appearing and in no acute distress.  No signs of respiratory distress or airway compromise.  HENT:  Head: Normocephalic and atraumatic.  Right Ear: Tympanic membrane normal.  Left Ear: Tympanic membrane normal.  Nose: Mucosal edema present.  Mouth/Throat: Uvula is midline. No oral lesions. No trismus in the jaw. No dental abscesses or uvula swelling. Posterior oropharyngeal edema and posterior oropharyngeal erythema present. Tonsils are 2+ on the right. Tonsils are 2+ on the left. No tonsillar exudate.  Patient does not appear to be in acute distress. No trismus or drooling present. No pooling of secretions. Patient is tolerating secretions and is not in respiratory distress. No neck pain or  tenderness to palpation of the neck. Full active and passive range of motion of the neck. No evidence of RPA or PTA.  Eyes: Conjunctivae and EOM are normal. Pupils are equal, round, and reactive to light. Right eye exhibits no discharge. Left eye exhibits no discharge. No scleral icterus.  Right eye with injected conjunctiva, no eyelid swelling or erythema or tenderness to palpation.  Mild clear tearful drainage noted.  No foreign bodies noted.  No pain with EOMs.  No chemosis, proptosis, or consensual photophobia.  Neck: Normal range of motion. Neck supple.  Cardiovascular: Normal rate, regular rhythm, normal heart sounds and intact distal pulses. Exam reveals no gallop and no friction rub.  No murmur heard. Pulmonary/Chest: Effort normal and breath sounds normal. No respiratory distress.  Abdominal: Soft. Bowel sounds are normal. He exhibits no distension. There is no tenderness. There is no guarding.  No abdominal tenderness to palpation.  Musculoskeletal: Normal range of  motion. He exhibits no edema.  Neurological: He is alert. He exhibits normal muscle tone. Coordination normal.  Skin: Skin is warm and dry. No rash noted.  Psychiatric: He has a normal mood and affect.  Nursing note and vitals reviewed.    ED Treatments / Results  Labs (all labs ordered are listed, but only abnormal results are displayed) Labs Reviewed  RAPID STREP SCREEN (NOT AT [facility]) - Abnormal; Notable for the following components:      Result Value   Streptococcus, Group A Screen (Direct) POSITIVE (*)    All other components within normal limits  BASIC METABOLIC PANEL - Abnormal; Notable for the following components:   Potassium 3.4 (*)    Glucose, Bld 125 (*)    All other components within normal limits  CBC WITH DIFFERENTIAL/PLATELET - Abnormal; Notable for the following components:   WBC 10.6 (*)    Neutro Abs 8.4 (*)    All other components within normal limits    EKG  EKG Interpretation None        Radiology No results found.  Procedures Procedures (including critical care time)  Medications Ordered in ED Medications  dexamethasone (DECADRON) injection 10 mg (not administered)     Initial Impression / Assessment and Plan / ED Course  I have reviewed the triage vital signs and the nursing notes.  Pertinent labs & imaging results that were available during my care of the patient were reviewed by me and considered in my medical decision making (see chart for details).     Patient presents to ED for evaluation of 2-day history of sore throat, subjective fever, nasal congestion and also 2 episodes of diarrhea and ongoing feelings of nausea.  Sick contacts with similar symptoms last week.  Has not taken any medications to help with symptoms.  On physical exam patient is overall well-appearing.  He is afebrile with no use of antipyretics recently.  He does have bilateral tonsillar edema but no uvula abnormalities, tonsillar exudates, trismus, drooling or signs that would concern me for RPA or PTA.  He does not appear clinically dehydrated.  He has no abdominal tenderness to palpation.  He is speaking complete sentences without difficulty.  Strep test returned as positive.  CBC, BMP unremarkable with no signs of decreasing kidney function or dehydration.  Right eye with findings consistent with possible viral conjunctivitis.  No pain with EOMs, foreign body sensation, injury or trauma to area.  Low suspicion for corneal abrasion, iritis, HSV or other emergent process.  Patient is not a contact lens wearer.  Will give medications including amoxicillin for strep, antibiotic ointment and eyedrops for conjunctivitis, Zofran for nausea and vomiting as well as encouraging supportive measures.  Patient able to tolerate p.o. intake here in the ED without the use of anti-antiemetics.  Patient appears stable for discharge at this time.  Strict return precautions given.  Final Clinical Impressions(s) /  ED Diagnoses   Final diagnoses:  Strep pharyngitis  Viral conjunctivitis, right eye    ED Discharge Orders        Ordered    amoxicillin (AMOXIL) 500 MG capsule  Daily     04/26/17 1824    ondansetron (ZOFRAN ODT) 4 MG disintegrating tablet  Every 8 hours PRN     04/26/17 1824    lidocaine (XYLOCAINE) 2 % solution  As needed     04/26/17 1824    naphazoline-pheniramine (NAPHCON-A) 0.025-0.3 % ophthalmic solution  4 times daily PRN  04/26/17 1824    erythromycin ophthalmic ointment     04/26/17 1824     Portions of this note were generated with Dragon dictation software. Dictation errors may occur despite best attempts at proofreading.    Dietrich PatesKhatri, Ladonte Verstraete, PA-C 04/26/17 1836    Terrilee FilesButler, Rajveer C, MD 04/27/17 Rickey [id number]

## 2017-04-26 NOTE — ED Triage Notes (Signed)
Patient c/o sore throat, n/v/d for couple days. Patient tried taking OTC medications.

## 2018-10-01 IMAGING — CT CT MAXILLOFACIAL W/O CM
3 of 4 series · 16 of 47 positions shown, 19 images · non-contrast
Comparison: Prior CT from 05/14/2012.

CLINICAL DATA: Initial evaluation for acute trauma, elbow and an
right show off.

EXAM:
CT MAXILLOFACIAL WITHOUT CONTRAST
TECHNIQUE: Multidetector CT imaging of the maxillofacial structures was
performed. Multiplanar CT image reconstructions were also generated.
A small metallic BB was placed on the right temple in order to
reliably differentiate right from left.

[Series 2: max soft · axial · 0.40mm/px · z∈[+802,+958]mm · 12 of 92 slices shown, 15 images]
[im 7/92  brain]
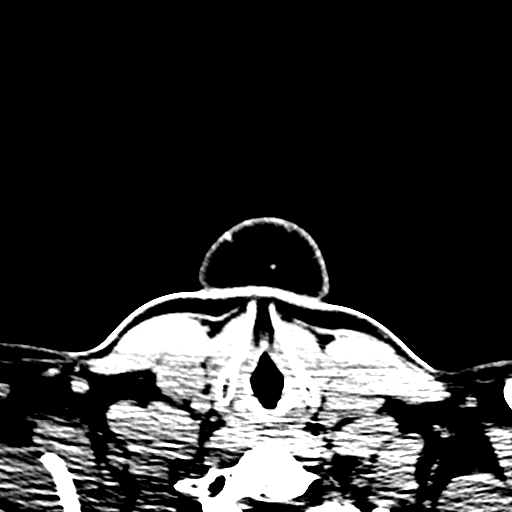
[im 7/92  bone]
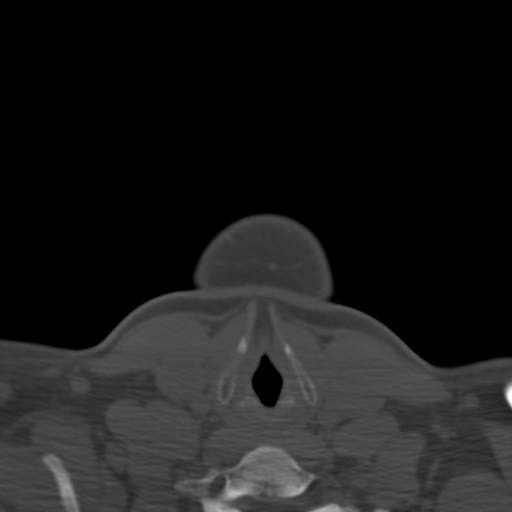
[im 13/92  bone]
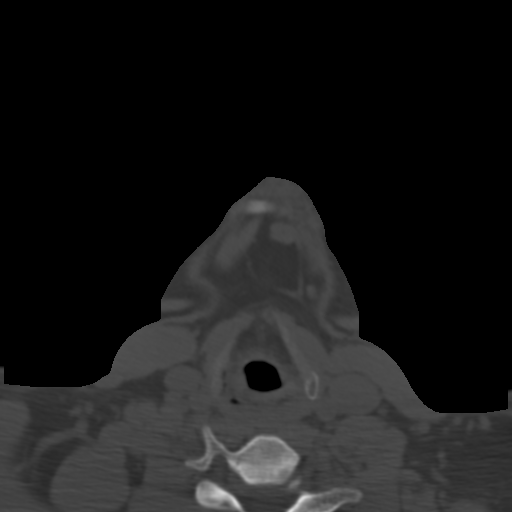
[im 19/92  bone]
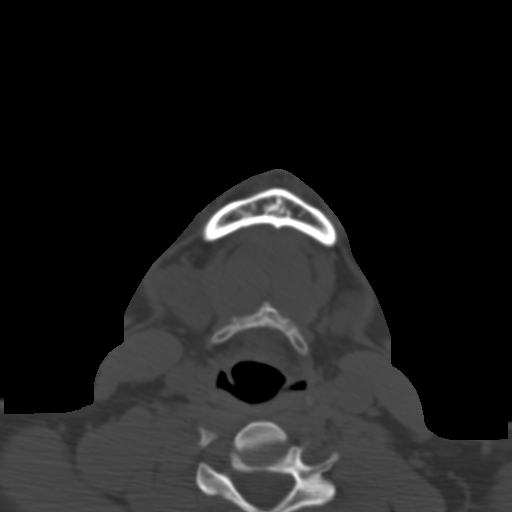
[im 29/92  bone]
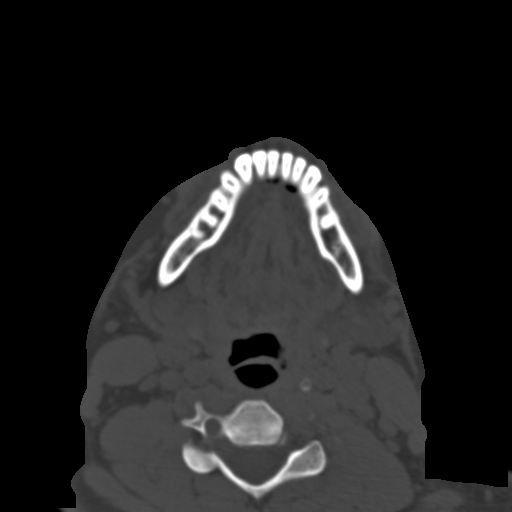
[im 35/92  brain]
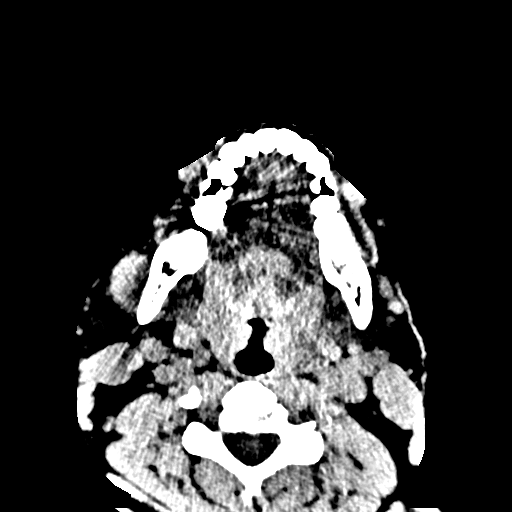
[im 35/92  bone]
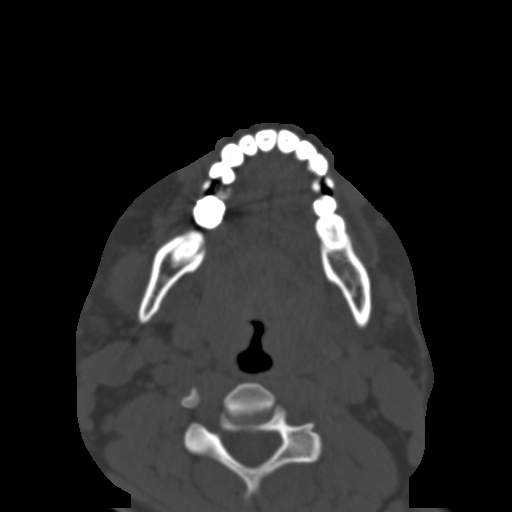
[im 41/92  bone]
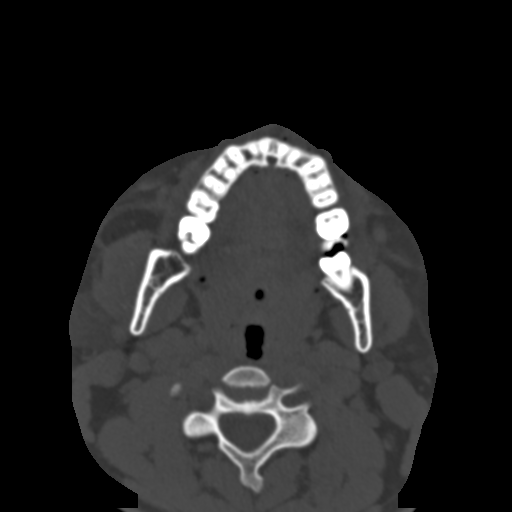
[im 51/92  bone]
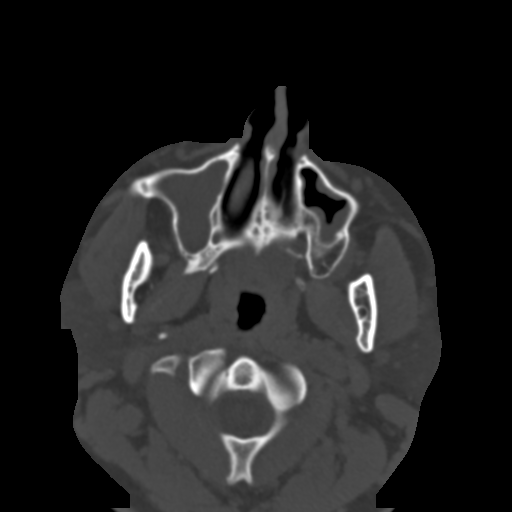
[im 57/92  bone]
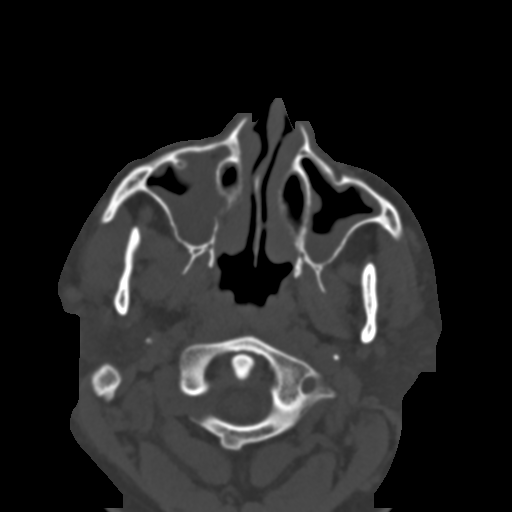
[im 63/92  brain]
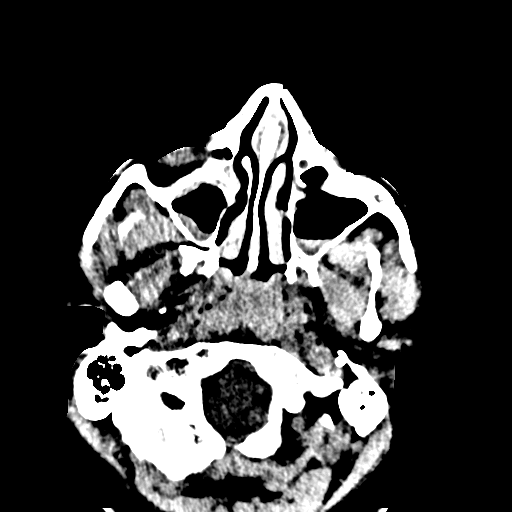
[im 63/92  bone]
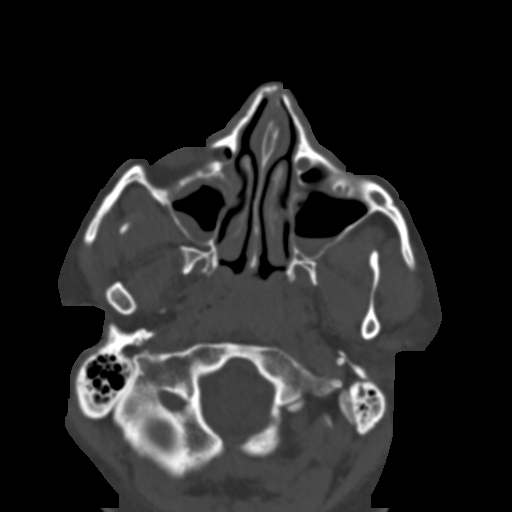
[im 73/92  bone]
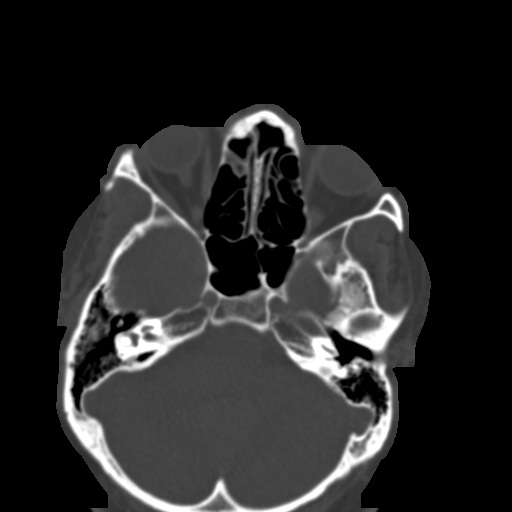
[im 79/92  bone]
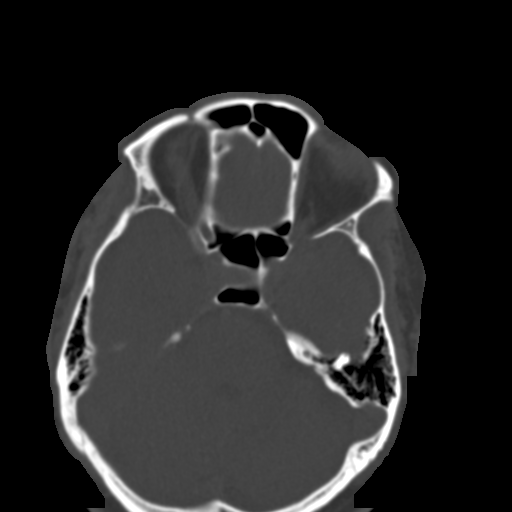
[im 85/92  bone]
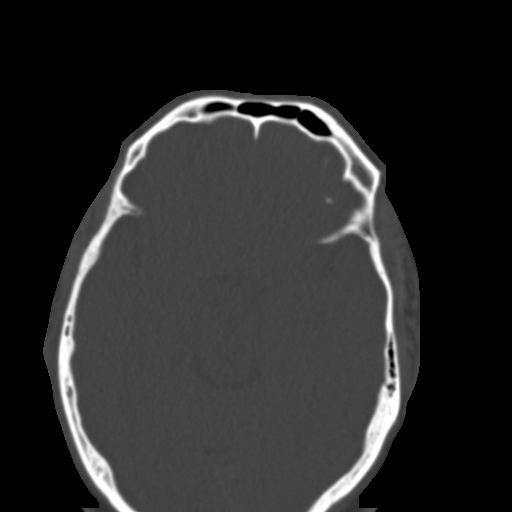

[Series 6: coronal soft · coronal · 0.42mm/px · 3 of 80 slices shown]
[im 27/80  bone]
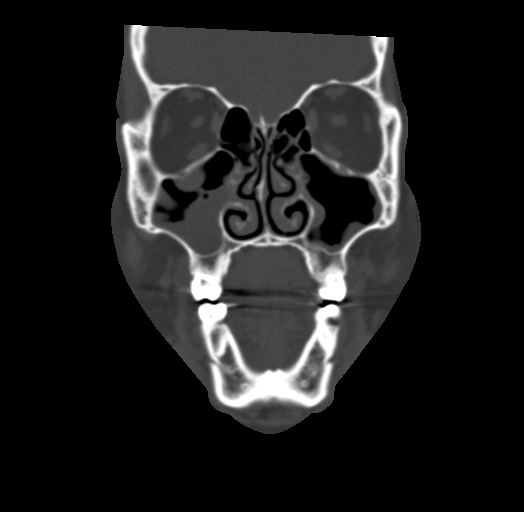
[im 36/80  bone]
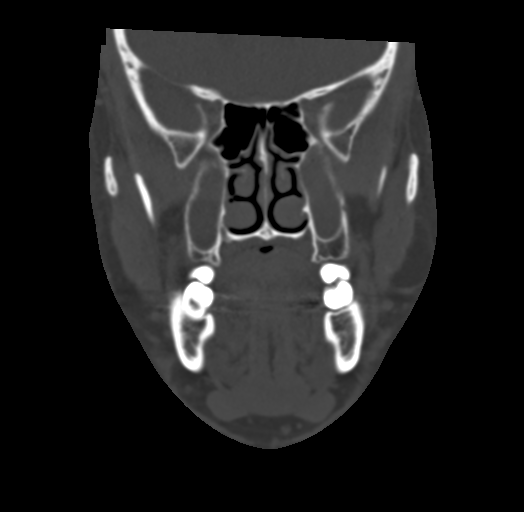
[im 44/80  bone]
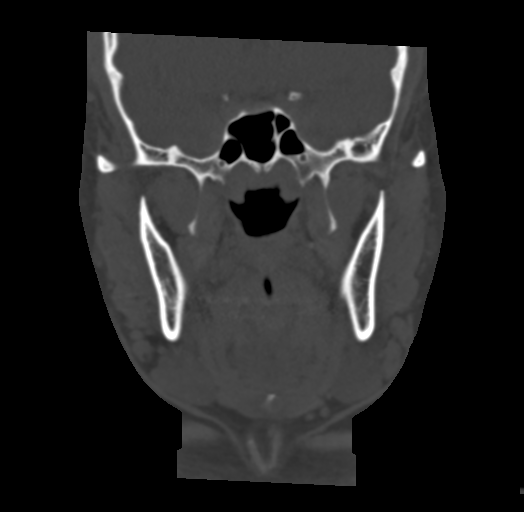

[Series 9: sagittal bone · sagittal · 0.36mm/px · 1 of 86 slices shown]
[im 43/86  bone]
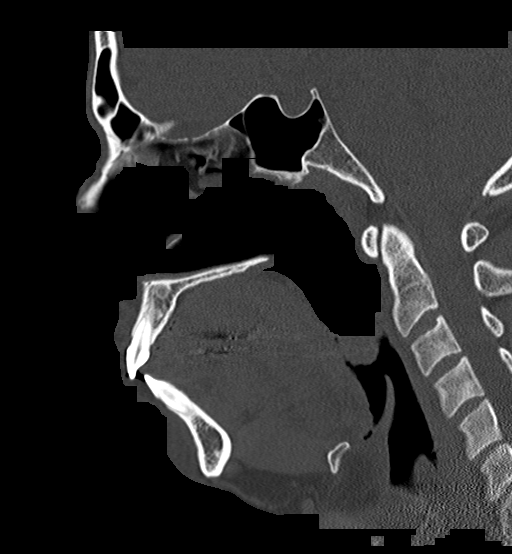

[16 of 47 positions shown; findings below may reference images not displayed]

FINDINGS: Osseous: Zygomatic arches intact. No acute maxillary fracture.
Pterygoid plates intact. Nasal bones intact. Nasal septum mildly
bowed to the right but intact. No acute mandibular fracture.
Mandibular condyles normally situated. No acute abnormality about
the dentition.

Orbits: Globes intact. No retro-orbital hematoma or other pathology.
Bony orbits intact without evidence for orbital floor fracture.

Sinuses: Moderate mucosal thickening throughout the maxillary
sinuses and ethmoidal air cells. Superimposed fluid levels present
within the maxillary sinuses bilaterally. Changes are likely
allergic/inflammatory nature. Mastoids are clear.

Soft tissues: No appreciable soft tissue injury within the face.

Limited intracranial: Unremarkable.
IMPRESSION: 1. No acute maxillofacial injury identified.  No fracture.
2. Acute moderate maxillary and ethmoidal sinusitis, which may be
either infectious or inflammatory in nature.

## 2019-04-12 IMAGING — CR DG CHEST 2V
2 series · 3 of 3 positions shown · non-contrast
Comparison: January 19, 2015

CLINICAL DATA: Chest pressure.

EXAM:
CHEST  2 VIEW

[Series 1: chest pa · 0.14mm/px · 2 of 2 slices shown]
[im 1/2]
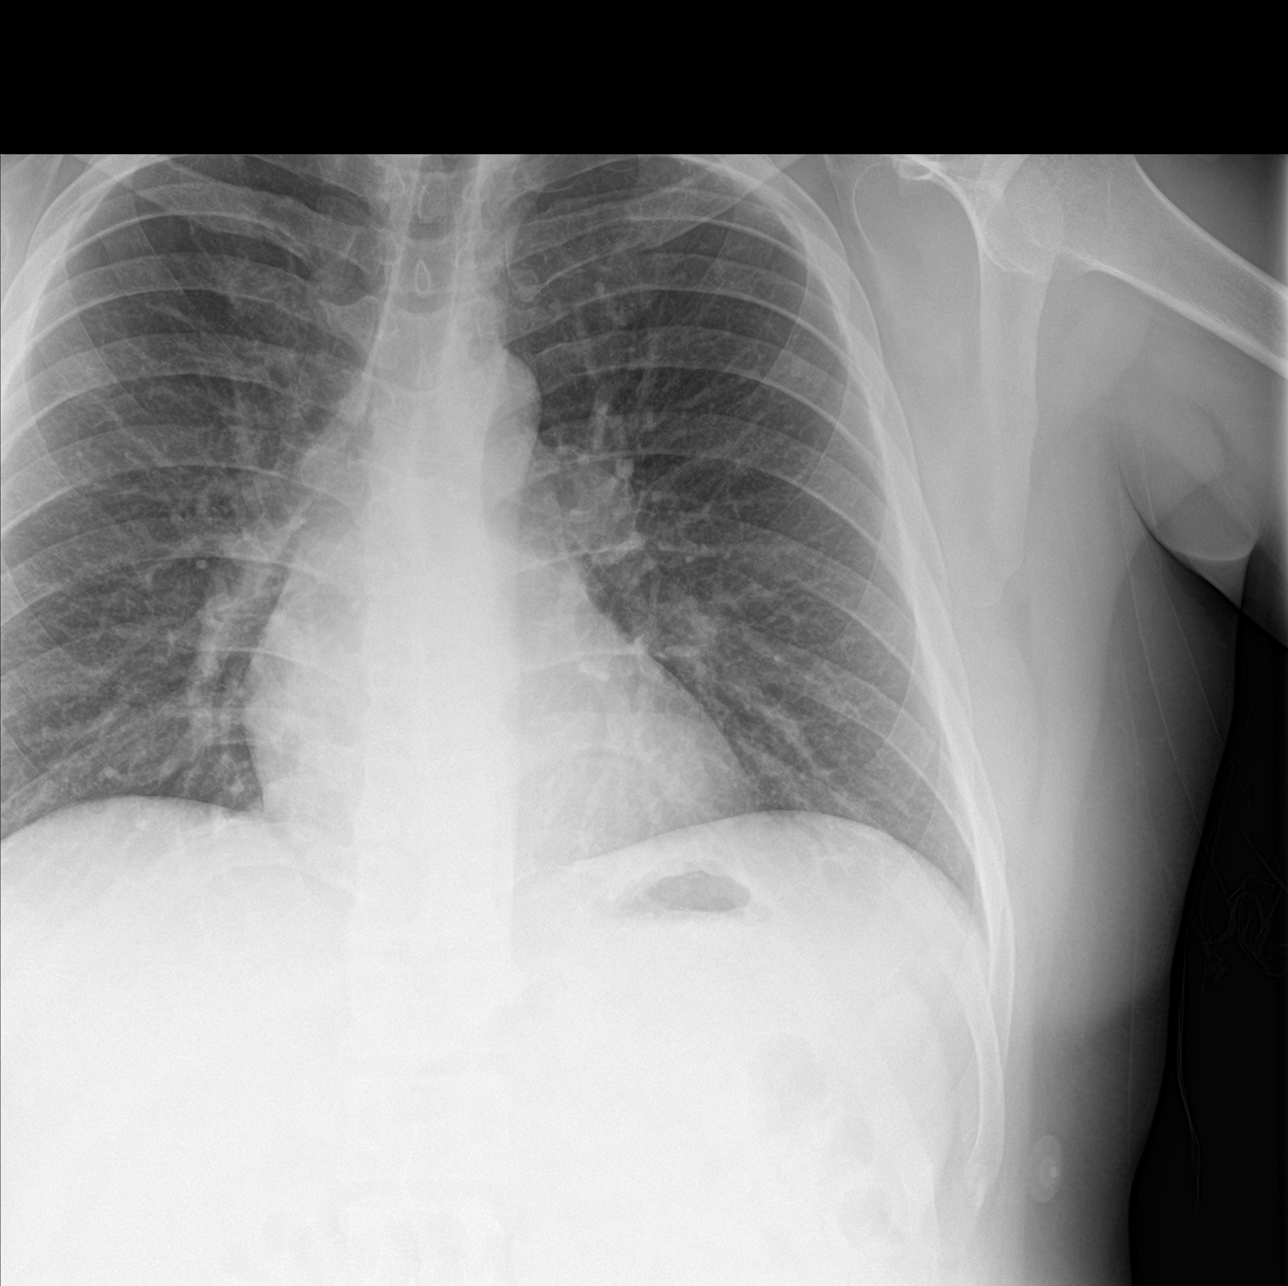
[im 2/2]
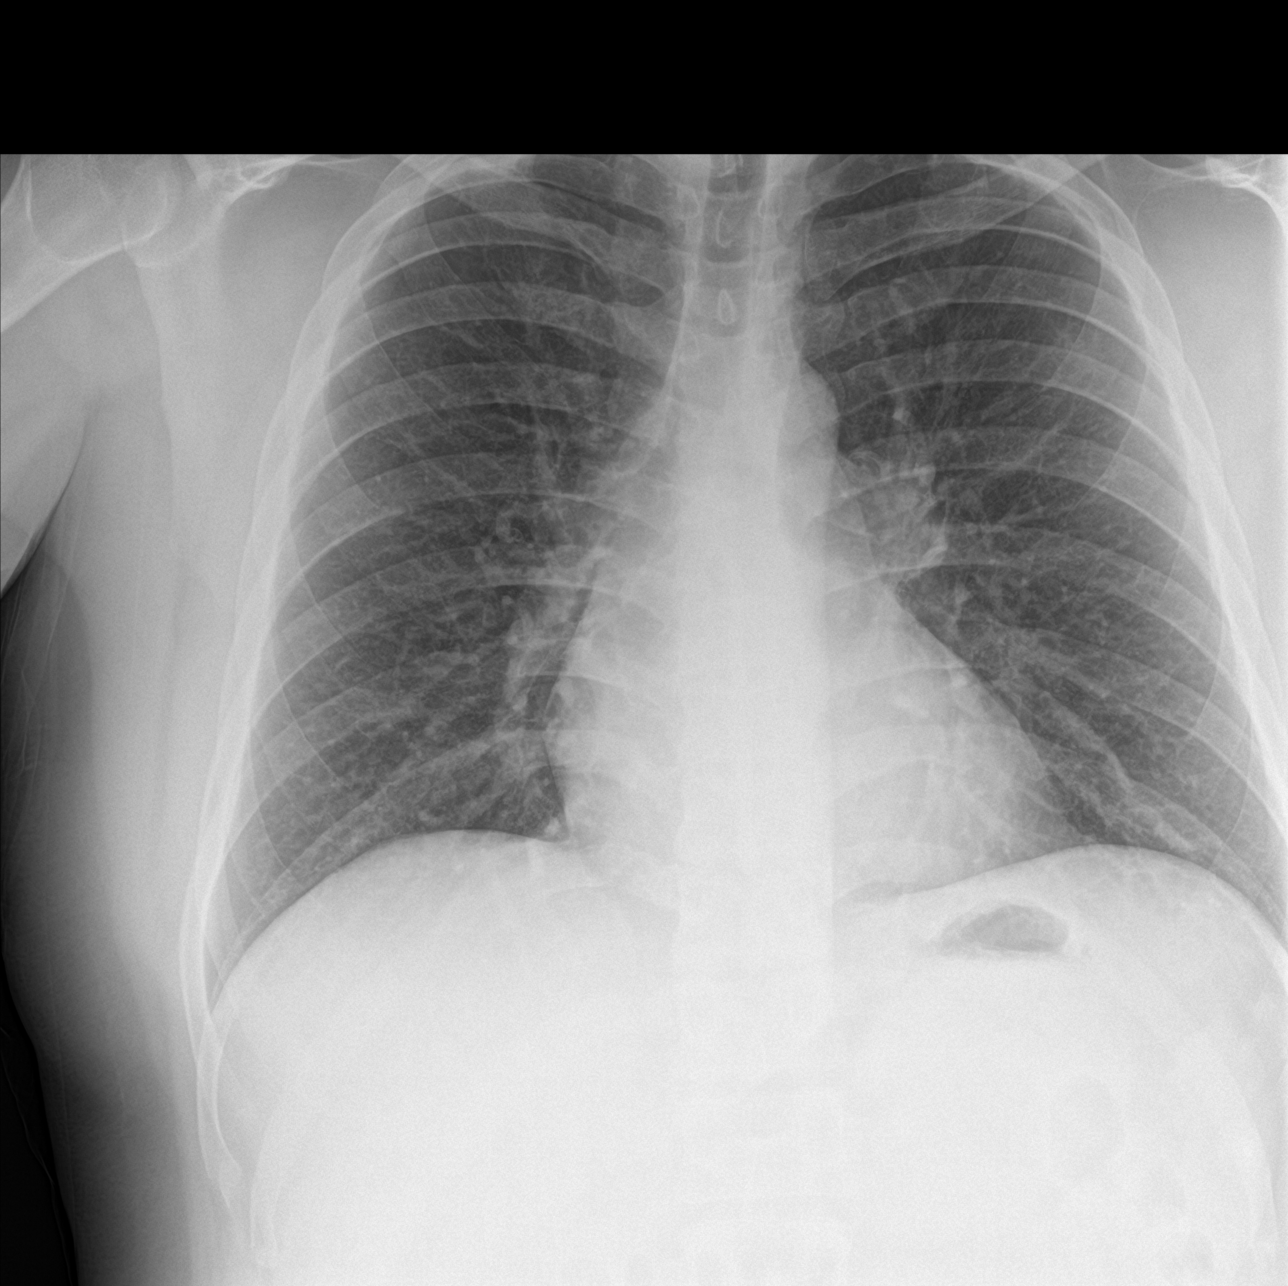

[chest lat]
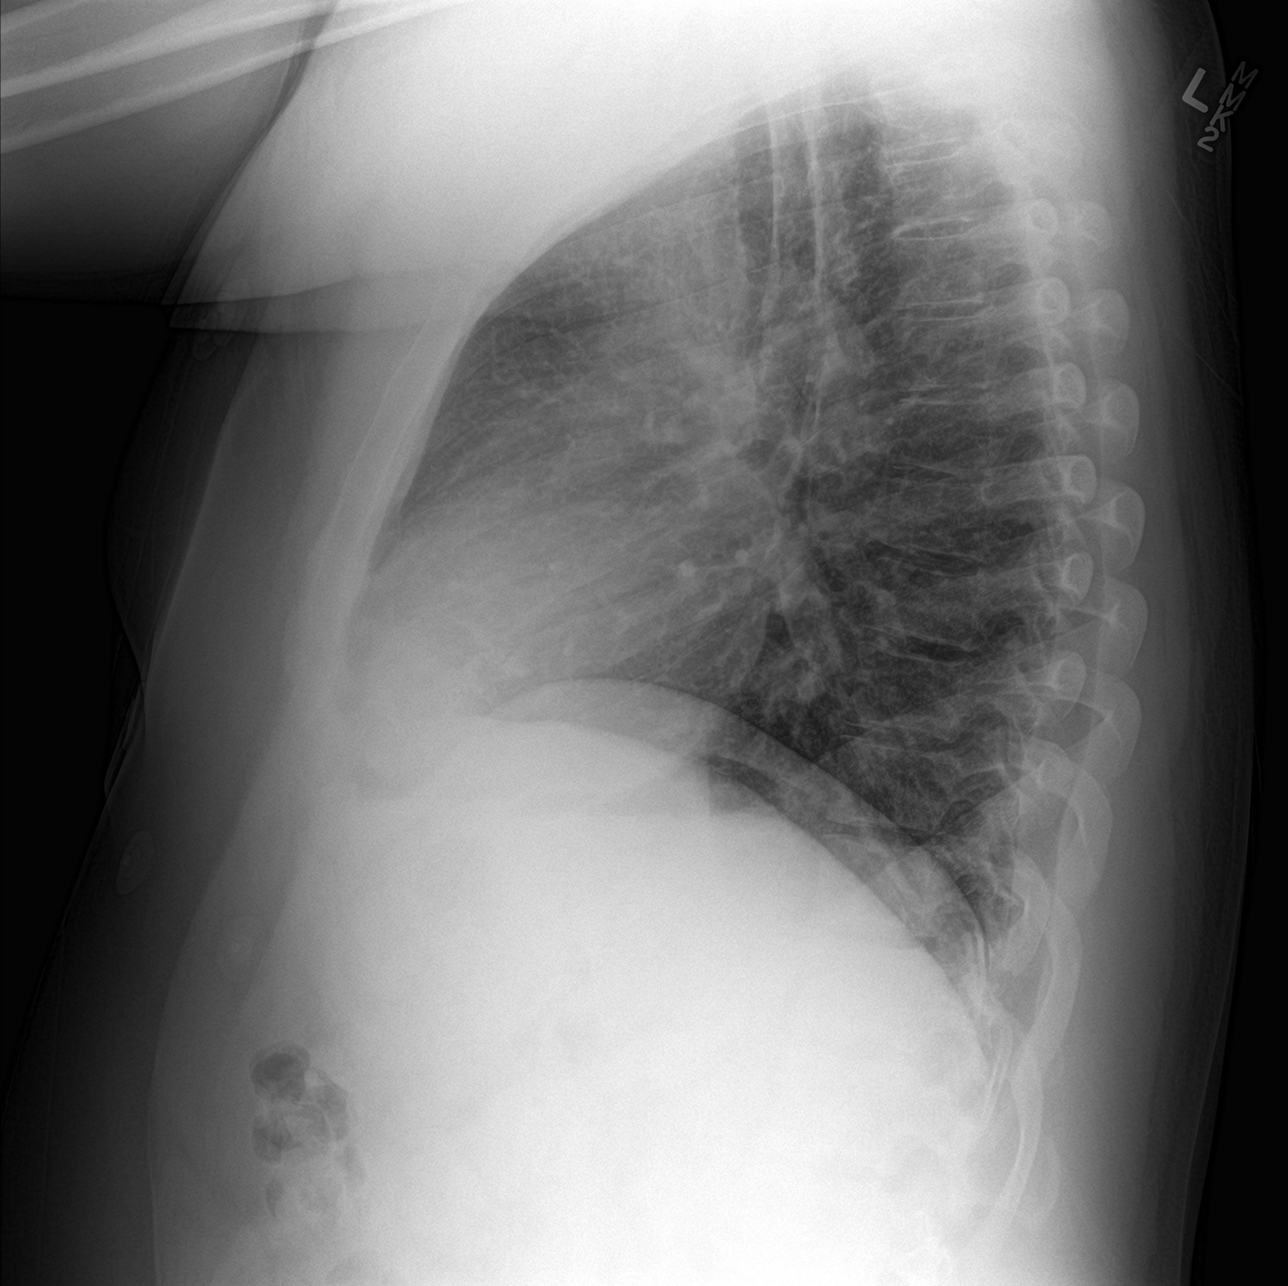

[3 of 3 positions shown; findings below may reference images not displayed]

FINDINGS: The heart, hila, and mediastinum are normal. No pulmonary nodules or
masses. No focal infiltrates.
IMPRESSION: No active cardiopulmonary disease.

## 2020-01-16 ENCOUNTER — Emergency Department (HOSPITAL_COMMUNITY): Admission: EM | Admit: 2020-01-16 | Discharge: 2020-01-16 | Discharge status: Against Medical Advice | Payer: Self-pay

## 2020-01-16 ENCOUNTER — Other Ambulatory Visit: Payer: Self-pay

## 2020-01-16 NOTE — ED Notes (Signed)
Called for triage, no answer

## 2022-11-09 ENCOUNTER — Other Ambulatory Visit (HOSPITAL_BASED_OUTPATIENT_CLINIC_OR_DEPARTMENT_OTHER): Payer: Self-pay

## 2022-11-09 ENCOUNTER — Emergency Department (HOSPITAL_BASED_OUTPATIENT_CLINIC_OR_DEPARTMENT_OTHER): Payer: Self-pay

## 2022-11-09 ENCOUNTER — Emergency Department (HOSPITAL_BASED_OUTPATIENT_CLINIC_OR_DEPARTMENT_OTHER)
Admission: EM | Admit: 2022-11-09 | Discharge: 2022-11-09 | Disposition: A | Discharge status: Home / Self Care | Payer: Self-pay | Attending: Emergency Medicine | Admitting: Emergency Medicine

## 2022-11-09 ENCOUNTER — Encounter (HOSPITAL_BASED_OUTPATIENT_CLINIC_OR_DEPARTMENT_OTHER): Payer: Self-pay

## 2022-11-09 ENCOUNTER — Other Ambulatory Visit: Payer: Self-pay

## 2022-11-09 DIAGNOSIS — R197 Diarrhea, unspecified: Secondary | ICD-10-CM | POA: Insufficient documentation

## 2022-11-09 DIAGNOSIS — D72829 Elevated white blood cell count, unspecified: Secondary | ICD-10-CM | POA: Insufficient documentation

## 2022-11-09 DIAGNOSIS — R112 Nausea with vomiting, unspecified: Secondary | ICD-10-CM | POA: Insufficient documentation

## 2022-11-09 DIAGNOSIS — R1084 Generalized abdominal pain: Secondary | ICD-10-CM | POA: Insufficient documentation

## 2022-11-09 LAB — CBC WITH DIFFERENTIAL/PLATELET
Abs Immature Granulocytes: 0.08 10*3/uL — ABNORMAL HIGH (ref 0.00–0.07)
Basophils Absolute: 0 10*3/uL (ref 0.0–0.1)
Basophils Relative: 0 %
Eosinophils Absolute: 0 10*3/uL (ref 0.0–0.5)
Eosinophils Relative: 0 %
HCT: 43.3 % (ref 39.0–52.0)
Hemoglobin: 14.8 g/dL (ref 13.0–17.0)
Immature Granulocytes: 1 %
Lymphocytes Relative: 10 %
Lymphs Abs: 1.1 10*3/uL (ref 0.7–4.0)
MCH: 27.2 pg (ref 26.0–34.0)
MCHC: 34.2 g/dL (ref 30.0–36.0)
MCV: 79.6 fL — ABNORMAL LOW (ref 80.0–100.0)
Monocytes Absolute: 0.5 10*3/uL (ref 0.1–1.0)
Monocytes Relative: 5 %
Neutro Abs: 8.9 10*3/uL — ABNORMAL HIGH (ref 1.7–7.7)
Neutrophils Relative %: 84 %
Platelets: 247 10*3/uL (ref 150–400)
RBC: 5.44 MIL/uL (ref 4.22–5.81)
RDW: 14.3 % (ref 11.5–15.5)
WBC: 10.6 10*3/uL — ABNORMAL HIGH (ref 4.0–10.5)
nRBC: 0 % (ref 0.0–0.2)

## 2022-11-09 LAB — URINALYSIS, ROUTINE W REFLEX MICROSCOPIC
Bilirubin Urine: NEGATIVE
Glucose, UA: NEGATIVE mg/dL
Hgb urine dipstick: NEGATIVE
Ketones, ur: NEGATIVE mg/dL
Leukocytes,Ua: NEGATIVE
Nitrite: NEGATIVE
Specific Gravity, Urine: 1.036 — ABNORMAL HIGH (ref 1.005–1.030)
pH: 8 (ref 5.0–8.0)

## 2022-11-09 LAB — COMPREHENSIVE METABOLIC PANEL
ALT: 19 U/L (ref 0–44)
AST: 19 U/L (ref 15–41)
Albumin: 4 g/dL (ref 3.5–5.0)
Alkaline Phosphatase: 55 U/L (ref 38–126)
Anion gap: 7 (ref 5–15)
BUN: 10 mg/dL (ref 6–20)
CO2: 23 mmol/L (ref 22–32)
Calcium: 8.6 mg/dL — ABNORMAL LOW (ref 8.9–10.3)
Chloride: 109 mmol/L (ref 98–111)
Creatinine, Ser: 0.83 mg/dL (ref 0.61–1.24)
GFR, Estimated: >60 mL/min (ref >60)
Glucose, Bld: 124 mg/dL — ABNORMAL HIGH (ref 70–99)
Potassium: 3.8 mmol/L (ref 3.5–5.1)
Sodium: 139 mmol/L (ref 135–145)
Total Bilirubin: 0.4 mg/dL (ref 0.3–1.2)
Total Protein: 6.6 g/dL (ref 6.5–8.1)

## 2022-11-09 LAB — LIPASE, BLOOD: Lipase: 34 U/L (ref 11–51)

## 2022-11-09 MED ORDER — SODIUM CHLORIDE 0.9 % IV BOLUS
1000.0000 mL | Freq: Once | INTRAVENOUS | Status: AC
  Administered 2022-11-09: 1000 mL via INTRAVENOUS

## 2022-11-09 MED ORDER — ALUM & MAG HYDROXIDE-SIMETH 200-200-20 MG/5ML PO SUSP
30.0000 mL | Freq: Once | ORAL | Status: AC
  Administered 2022-11-09: 30 mL via ORAL
  Filled 2022-11-09: qty 30

## 2022-11-09 MED ORDER — IOHEXOL 300 MG/ML  SOLN
100.0000 mL | Freq: Once | INTRAMUSCULAR | Status: AC | PRN
  Administered 2022-11-09: 100 mL via INTRAVENOUS

## 2022-11-09 MED ORDER — FAMOTIDINE IN NACL 20-0.9 MG/50ML-% IV SOLN
20.0000 mg | Freq: Once | INTRAVENOUS | Status: AC
  Administered 2022-11-09: 20 mg via INTRAVENOUS
  Filled 2022-11-09: qty 50

## 2022-11-09 MED ORDER — ONDANSETRON HCL 4 MG PO TABS: 4.0000 mg | ORAL_TABLET | Freq: Three times a day (TID) | ORAL | 0 refills | Status: DC | PRN

## 2022-11-09 MED ORDER — ONDANSETRON HCL 4 MG/2ML IJ SOLN
4.0000 mg | Freq: Once | INTRAMUSCULAR | Status: AC
  Administered 2022-11-09: 4 mg via INTRAVENOUS
  Filled 2022-11-09: qty 2

## 2022-11-09 NOTE — ED Provider Notes (Signed)
[place] EMERGENCY DEPARTMENT AT [facility] Provider Note   CSN: [id number] Arrival date & time: 11/09/22  4742     History  No chief complaint on file.   Jeremy Horn is a 32 y.o. male who presents emergency department with concerns for upper abdominal pain onset this morning.  Has associated nausea, vomiting, and diarrhea (1 episode of watery nonbloody diarrhea).  Tried ibuprofen at home prior to arrival.  Denies fever, urinary symptoms, chest pain, shortness of breath, constipation.  The history is provided by the patient. No language interpreter was used.       Home Medications Prior to Admission medications   Medication Sig Start Date End Date Taking? Authorizing Provider  ondansetron (ZOFRAN) 4 MG tablet Take 1 tablet (4 mg total) by mouth every 8 (eight) hours as needed for nausea or vomiting. 11/09/22  Yes Blimie Vaness A, PA-C  cephALEXin (KEFLEX) 500 MG capsule Take 1 capsule (500 mg total) by mouth 4 (four) times daily. Patient not taking: Reported on 03/17/2017 02/14/17   Pisciotta, Joni Reining, PA-C  clindamycin (CLEOCIN) 150 MG capsule Take 1 capsule (150 mg total) by mouth every 6 (six) hours. Patient not taking: Reported on 03/17/2017 02/06/17   Lorre Nick, MD  diphenhydrAMINE (BENADRYL) 25 mg capsule Take 1 capsule (25 mg total) by mouth every 6 (six) hours as needed for itching. 02/07/17   Joy, Shawn C, PA-C  erythromycin ophthalmic ointment Place a 1/2 inch ribbon of ointment into the lower eyelid. 04/26/17   Khatri, Hina, PA-C  famotidine (PEPCID) 20 MG tablet Take 1 tablet (20 mg total) by mouth 2 (two) times daily. 03/13/17   Robinson, [place] N, PA-C  hydrOXYzine (ATARAX/VISTARIL) 25 MG tablet Take 1 tablet (25 mg total) by mouth every 6 (six) hours as needed for anxiety. 02/14/17   Pisciotta, Joni Reining, PA-C  ibuprofen (ADVIL,MOTRIN) 800 MG tablet Take 1 tablet (800 mg total) by mouth every 8 (eight) hours as needed. 09/01/16   Lawyer, Cristal Deer, PA-C   lidocaine (XYLOCAINE) 2 % solution Use as directed 15 mLs in the mouth or throat as needed for mouth pain. 04/26/17   Khatri, Hina, PA-C  naphazoline-pheniramine (NAPHCON-A) 0.025-0.3 % ophthalmic solution Place 1 drop into the right eye 4 (four) times daily as needed for eye irritation. 04/26/17   Khatri, Hina, PA-C  predniSONE (STERAPRED UNI-PAK 21 TAB) 10 MG (21) TBPK tablet Take 6 tabs by mouth daily  for 2 days, then 5 tabs for 2 days, then 4 tabs for 2 days, then 3 tabs for 2 days, 2 tabs for 2 days, then 1 tab by mouth daily for 2 days Patient not taking: Reported on 03/17/2017 02/07/17   Anselm Pancoast, PA-C      Allergies    Shellfish allergy and Sulfa antibiotics    Review of Systems   Review of Systems  All other systems reviewed and are negative.   Physical Exam Updated Vital Signs BP 114/82 (BP Location: Right Arm)   Pulse 62   Temp 97.9 F (36.6 C)   Ht 6\' 3"  (1.905 m)   Wt 121.6 kg   SpO2 100%   BMI 33.50 kg/m  Physical Exam Vitals and nursing note reviewed.  Constitutional:      General: He is not in acute distress.    Appearance: Normal appearance. He is not diaphoretic.  HENT:     Head: Normocephalic and atraumatic.     Mouth/Throat:     Pharynx: No oropharyngeal exudate.  Eyes:  General: No scleral icterus.    Extraocular Movements: Extraocular movements intact.     Conjunctiva/sclera: Conjunctivae normal.  Cardiovascular:     Rate and Rhythm: Normal rate and regular rhythm.     Pulses: Normal pulses.     Heart sounds: Normal heart sounds.  Pulmonary:     Effort: Pulmonary effort is normal. No respiratory distress.     Breath sounds: Normal breath sounds. No wheezing.  Abdominal:     General: Bowel sounds are normal.     Palpations: Abdomen is soft. There is no mass.     Tenderness: There is abdominal tenderness in the epigastric area and left upper quadrant. There is no guarding or rebound.     Comments: Tenderness to palpation noted to epigastric  and left upper quadrant region  Musculoskeletal:        General: Normal range of motion.     Cervical back: Normal range of motion and neck supple.  Skin:    General: Skin is warm and dry.     Findings: No rash.  Neurological:     Mental Status: He is alert.     Sensory: Sensation is intact.     Motor: Motor function is intact.  Psychiatric:        Behavior: Behavior normal.     ED Results / Procedures / Treatments   Labs (all labs ordered are listed, but only abnormal results are displayed) Labs Reviewed  CBC WITH DIFFERENTIAL/PLATELET - Abnormal; Notable for the following components:      Result Value   WBC 10.6 (*)    MCV 79.6 (*)    Neutro Abs 8.9 (*)    Abs Immature Granulocytes 0.08 (*)    All other components within normal limits  COMPREHENSIVE METABOLIC PANEL - Abnormal; Notable for the following components:   Glucose, Bld 124 (*)    Calcium 8.6 (*)    All other components within normal limits  URINALYSIS, ROUTINE W REFLEX MICROSCOPIC - Abnormal; Notable for the following components:   Specific Gravity, Urine 1.036 (*)    Protein, ur TRACE (*)    All other components within normal limits  LIPASE, BLOOD    EKG EKG Interpretation Date/Time:  Monday November 09 2022 10:09:52 EDT Ventricular Rate:  61 PR Interval:  168 QRS Duration:  99 QT Interval:  409 QTC Calculation: 412 R Axis:   66  Text Interpretation: Sinus rhythm No significant change since last tracing Confirmed by Alvira Monday ([id number]) on 11/09/2022 12:10:17 PM  Radiology CT ABDOMEN PELVIS W CONTRAST  Result Date: 11/09/2022 CLINICAL DATA:  Nausea, vomiting, upper abdominal pain. EXAM: CT ABDOMEN AND PELVIS WITH CONTRAST TECHNIQUE: Multidetector CT imaging of the abdomen and pelvis was performed using the standard protocol following bolus administration of intravenous contrast. RADIATION DOSE REDUCTION: This exam was performed according to the departmental dose-optimization program which includes  automated exposure control, adjustment of the mA and/or kV according to patient size and/or use of iterative reconstruction technique. CONTRAST:  OMNIPAQUE IOHEXOL 300 MG/ML  SOLN COMPARISON:  None Available. FINDINGS: Lower chest: The lung bases are clear. The imaged heart is unremarkable. Hepatobiliary: The liver and gallbladder are unremarkable. There is no biliary ductal dilatation. Pancreas: Unremarkable. Spleen: Unremarkable. Adrenals/Urinary Tract: The adrenals are unremarkable. The kidneys are unremarkable, with no focal lesion, stone, hydronephrosis, or hydroureter. The bladder is unremarkable. Stomach/Bowel: The stomach is unremarkable. There is no evidence of bowel obstruction. There is no abnormal bowel wall thickening or inflammatory change. The  appendix is normal. Vascular/Lymphatic: The abdominal aorta is normal in course and caliber. The major branch vessels are patent. There is no abdominal or pelvic lymphadenopathy. Reproductive: The prostate and seminal vesicles are unremarkable. Other: There is no ascites or free air. There is small fat containing supraumbilical hernia as well as a small fat containing umbilical hernia without evidence of incarceration (6-81). Musculoskeletal: There is no acute osseous abnormality or suspicious osseous lesion. There is a nonaggressive appearing sclerotic lesion in the right iliac bone. IMPRESSION: 1. No acute finding to explain the patient's symptoms. 2. Small fat containing supraumbilical and umbilical hernias without evidence of strangulation. Electronically Signed   By: Lesia Hausen M.D.   On: 11/09/2022 11:52    Procedures Procedures    Medications Ordered in ED Medications  sodium chloride 0.9 % bolus 1,000 mL (0 mLs Intravenous Stopped 11/09/22 1225)  ondansetron (ZOFRAN) injection 4 mg (4 mg Intravenous Given 11/09/22 1011)  famotidine (PEPCID) IVPB 20 mg premix (0 mg Intravenous Stopped 11/09/22 1225)  alum & mag hydroxide-simeth  (MAALOX/MYLANTA) 200-200-20 MG/5ML suspension 30 mL (30 mLs Oral Given 11/09/22 1015)  iohexol (OMNIPAQUE) 300 MG/ML solution 100 mL (100 mLs Intravenous Contrast Given 11/09/22 1055)    ED Course/ Medical Decision Making/ A&P Clinical Course as of 11/09/22 1343  Mon Nov 09, 2022  1215 Re-evaluated and noted improvement of symptoms with treatment regimen. Discussed discharge treatment plan. Pt agreeable at this time. Pt appears safe for discharge. [SB]    Clinical Course User Index [SB] Revecca Nachtigal A, PA-C                             Medical Decision Making Amount and/or Complexity of Data Reviewed Labs: ordered. Radiology: ordered.  Risk OTC drugs. Prescription drug management.   Patient presents to the emergency department with upper abdominal pain onset this morning. Pt afebrile.  On exam patient with upper abdominal tenderness to palpation to the epigastric and left upper quadrant region.  Otherwise no acute cardiovascular respiratory exam findings.  Differential diagnosis includes pancreatitis, cholecystitis, choledocholithiasis, diverticulitis, GERD, ACS.  Labs:  I ordered, and personally interpreted labs.  The pertinent results include:  Lipase unremarkable CBC with mild elevated leukocytosis at 10.6 otherwise unremarkable CMP with slightly elevated glucose at 126 otherwise  unremarkable Urinalysis unremarkable  Imaging: I ordered imaging studies including CT abdomen pelvis with I independently visualized and interpreted imaging which showed  1. No acute finding to explain the patient's symptoms.  2. Small fat containing supraumbilical and umbilical hernias without  evidence of strangulation.   I agree with the radiologist interpretation  Medications:  I ordered medication including IVF, Zofran, and Pepcid for symptom management. Reevaluation of the patient after these medicines and interventions, I reevaluated the patient and found that they have improved I have  reviewed the patients home medicines and have made adjustments as needed   Disposition: Presentation suspicious for diffuse abdominal pain. Doubt concerns at this time for pancreatitis, appendicitis, diverticulitis, ACS. After consideration of the diagnostic results and the patients response to treatment, I feel that the patient would benefit from Discharge home. Discharged home with zofran Rx. Instructed on incidental findings of umbilical fat containing hernias and provided with information for central [place] surgery for follow up. Supportive care measures and strict return precautions discussed with patient at bedside. Pt acknowledges and verbalizes understanding. Pt appears safe for discharge. Follow up as indicated in discharge paperwork.   This  chart was dictated using voice recognition software, Dragon. Despite the best efforts of this provider to proofread and correct errors, errors may still occur which can change documentation meaning.   Final Clinical Impression(s) / ED Diagnoses Final diagnoses:  Generalized abdominal pain  Nausea and vomiting, unspecified vomiting type    Rx / DC Orders ED Discharge Orders          Ordered    ondansetron (ZOFRAN) 4 MG tablet  Every 8 hours PRN        11/09/22 1216              Mionna Advincula A, PA-C 11/09/22 1345    Alvira Monday, MD 11/13/22 2110

## 2022-11-09 NOTE — ED Triage Notes (Signed)
Pt c/o nausea, vomiting and upper abdominal pain that started this morning.

## 2022-11-09 NOTE — Discharge Instructions (Addendum)
It was a pleasure taking care of you today!  Your lab and imaging studies did not show any concerning emergent findings at this time.  Your CT did show concerns for fat containing hernias at your belly button. Attached is information for the on-call surgery group to call and set up a follow up appointment regarding todays ED visit. You will be sent a prescription for Zofran, take as directed if you are experiencing nausea/vomiting.  If you have to take Zofran, wait at least 30 minutes before you attempt small sips/small bites of food/fluid.  Ensure to maintain fluid intake with water, tea, broth, soup, Pedialyte, Gatorade.  Follow-up with your primary care provider as needed regarding this ED visit. Return to the emergency department if you experience increasing/worsening symptoms.

## 2024-01-11 ENCOUNTER — Other Ambulatory Visit: Payer: Self-pay

## 2024-01-11 ENCOUNTER — Emergency Department (HOSPITAL_BASED_OUTPATIENT_CLINIC_OR_DEPARTMENT_OTHER)
Admission: EM | Admit: 2024-01-11 | Discharge: 2024-01-11 | Disposition: A | Discharge status: Home / Self Care | Payer: Self-pay | Attending: Emergency Medicine | Admitting: Emergency Medicine

## 2024-01-11 ENCOUNTER — Emergency Department (HOSPITAL_BASED_OUTPATIENT_CLINIC_OR_DEPARTMENT_OTHER): Payer: Self-pay

## 2024-01-11 DIAGNOSIS — F17210 Nicotine dependence, cigarettes, uncomplicated: Secondary | ICD-10-CM | POA: Insufficient documentation

## 2024-01-11 DIAGNOSIS — F12188 Cannabis abuse with other cannabis-induced disorder: Secondary | ICD-10-CM | POA: Insufficient documentation

## 2024-01-11 DIAGNOSIS — R1116 Cannabis hyperemesis syndrome: Secondary | ICD-10-CM

## 2024-01-11 DIAGNOSIS — K8020 Calculus of gallbladder without cholecystitis without obstruction: Secondary | ICD-10-CM | POA: Insufficient documentation

## 2024-01-11 LAB — CBC
HCT: 45.8 % (ref 39.0–52.0)
Hemoglobin: 15 g/dL (ref 13.0–17.0)
MCH: 25.8 pg — ABNORMAL LOW (ref 26.0–34.0)
MCHC: 32.8 g/dL (ref 30.0–36.0)
MCV: 78.7 fL — ABNORMAL LOW (ref 80.0–100.0)
Platelets: 270 K/uL (ref 150–400)
RBC: 5.82 MIL/uL — ABNORMAL HIGH (ref 4.22–5.81)
RDW: 15.6 % — ABNORMAL HIGH (ref 11.5–15.5)
WBC: 13.1 K/uL — ABNORMAL HIGH (ref 4.0–10.5)
nRBC: 0 % (ref 0.0–0.2)

## 2024-01-11 LAB — COMPREHENSIVE METABOLIC PANEL WITH GFR
ALT: 25 U/L (ref 0–44)
AST: 23 U/L (ref 15–41)
Albumin: 3.8 g/dL (ref 3.5–5.0)
Alkaline Phosphatase: 73 U/L (ref 38–126)
Anion gap: 13 (ref 5–15)
BUN: 8 mg/dL (ref 6–20)
CO2: 19 mmol/L — ABNORMAL LOW (ref 22–32)
Calcium: 9.1 mg/dL (ref 8.9–10.3)
Chloride: 109 mmol/L (ref 98–111)
Creatinine, Ser: 0.92 mg/dL (ref 0.61–1.24)
GFR, Estimated: >60 mL/min (ref >60)
Glucose, Bld: 129 mg/dL — ABNORMAL HIGH (ref 70–99)
Potassium: 4 mmol/L (ref 3.5–5.1)
Sodium: 141 mmol/L (ref 135–145)
Total Bilirubin: 0.3 mg/dL (ref 0.0–1.2)
Total Protein: 6.6 g/dL (ref 6.5–8.1)

## 2024-01-11 LAB — URINALYSIS, ROUTINE W REFLEX MICROSCOPIC
Bilirubin Urine: NEGATIVE
Glucose, UA: NEGATIVE mg/dL
Hgb urine dipstick: NEGATIVE
Ketones, ur: NEGATIVE mg/dL
Leukocytes,Ua: NEGATIVE
Nitrite: NEGATIVE
Protein, ur: NEGATIVE mg/dL
Specific Gravity, Urine: 1.021 (ref 1.005–1.030)
pH: 7.5 (ref 5.0–8.0)

## 2024-01-11 LAB — LIPASE, BLOOD: Lipase: 45 U/L (ref 11–51)

## 2024-01-11 MED ORDER — METOCLOPRAMIDE HCL 5 MG/ML IJ SOLN
10.0000 mg | Freq: Once | INTRAMUSCULAR | Status: AC
  Administered 2024-01-11: 10 mg via INTRAVENOUS
  Filled 2024-01-11: qty 2

## 2024-01-11 MED ORDER — ONDANSETRON 4 MG PO TBDP: 4.0000 mg | ORAL_TABLET | Freq: Three times a day (TID) | ORAL | 0 refills | Status: DC | PRN

## 2024-01-11 MED ORDER — IOHEXOL 300 MG/ML  SOLN
100.0000 mL | Freq: Once | INTRAMUSCULAR | Status: AC | PRN
  Administered 2024-01-11: 100 mL via INTRAVENOUS

## 2024-01-11 MED ORDER — HYDROMORPHONE HCL 1 MG/ML IJ SOLN
1.0000 mg | Freq: Once | INTRAMUSCULAR | Status: AC
  Administered 2024-01-11: 1 mg via INTRAVENOUS
  Filled 2024-01-11: qty 1

## 2024-01-11 MED ORDER — SODIUM CHLORIDE 0.9 % IV BOLUS
1000.0000 mL | Freq: Once | INTRAVENOUS | Status: AC
  Administered 2024-01-11: 1000 mL via INTRAVENOUS

## 2024-01-11 MED ORDER — DROPERIDOL 2.5 MG/ML IJ SOLN
1.2500 mg | Freq: Once | INTRAMUSCULAR | Status: AC
  Administered 2024-01-11: 1.25 mg via INTRAVENOUS
  Filled 2024-01-11: qty 2

## 2024-01-11 MED ORDER — METOCLOPRAMIDE HCL 10 MG PO TABS: 10.0000 mg | ORAL_TABLET | Freq: Three times a day (TID) | ORAL | 0 refills | Status: AC | PRN

## 2024-01-11 NOTE — ED Notes (Signed)
 Pt returned from CT

## 2024-01-11 NOTE — Discharge Instructions (Addendum)
 Your CT scan notes that there are some gallstones in her gallbladder.  These most likely are not causing your symptoms today but you should follow-up with a surgeon as an outpatient.  Your presentation today is most likely related to [facility] use.  This is a condition called cannabinoid hyperemesis syndrome.  The best treatment would be to stop marijuana.  You should follow-up with a primary care provider.  Otherwise, if you develop new or worsening symptoms including abdominal pain, vomiting, fever, or any other new/concerning symptoms then return to the ER.

## 2024-01-11 NOTE — ED Triage Notes (Signed)
 Patient reports generalized abdominal pain with nausea and diarrhea since 2 am this morning.

## 2024-01-11 NOTE — ED Notes (Signed)
 Went into RM to give pt d/c paperwork and go over d/c instructions, pt was not in room and family member was gone as well. IV cath was in garbage appearing that pt left prior to receiving d/c paperwork.

## 2024-01-11 NOTE — ED Provider Notes (Signed)
 [place] EMERGENCY DEPARTMENT AT [facility] Provider Note   CSN: [id number] Arrival date & time: 01/11/24  9162     Patient presents with: Diarrhea   Jeremy Horn is a 33 y.o. male.   HPI 33 year old male presents with sudden onset abdominal pain and vomiting.  Started around 2 AM.  He had a couple episodes of nonbloody emesis.  He had 1 loose stool.  Otherwise has been having abdominal pain that is generalized.  He has had this before about 4 or 5 different times but does not remember the diagnosis.  This feels very similar to those times.  No back pain or shortness of breath.  No fevers.  He smokes cigarettes but denies any significant alcohol use.  He does use marijuana 2-3 times per day.  The pain is currently severe.  Prior to Admission medications   Medication Sig Start Date End Date Taking? Authorizing Provider  metoCLOPramide (REGLAN) 10 MG tablet Take 1 tablet (10 mg total) by mouth every 8 (eight) hours as needed for nausea. 01/11/24  Yes Freddi Hamilton, MD  ondansetron  (ZOFRAN -ODT) 4 MG disintegrating tablet Take 1 tablet (4 mg total) by mouth every 8 (eight) hours as needed for nausea or vomiting. 01/11/24  Yes Freddi Hamilton, MD  cephALEXin  (KEFLEX ) 500 MG capsule Take 1 capsule (500 mg total) by mouth 4 (four) times daily. Patient not taking: Reported on 03/17/2017 02/14/17   Pisciotta, Nat, PA-C  clindamycin  (CLEOCIN ) 150 MG capsule Take 1 capsule (150 mg total) by mouth every 6 (six) hours. Patient not taking: Reported on 03/17/2017 02/06/17   Dasie Faden, MD  diphenhydrAMINE  (BENADRYL ) 25 mg capsule Take 1 capsule (25 mg total) by mouth every 6 (six) hours as needed for itching. 02/07/17   Joy, Shawn C, PA-C  erythromycin  ophthalmic ointment Place a 1/2 inch ribbon of ointment into the lower eyelid. 04/26/17   Khatri, Hina, PA-C  famotidine  (PEPCID ) 20 MG tablet Take 1 tablet (20 mg total) by mouth 2 (two) times daily. 03/13/17   Robinson, Jordan N, PA-C   hydrOXYzine  (ATARAX /VISTARIL ) 25 MG tablet Take 1 tablet (25 mg total) by mouth every 6 (six) hours as needed for anxiety. 02/14/17   Pisciotta, Nat, PA-C  ibuprofen  (ADVIL ,MOTRIN ) 800 MG tablet Take 1 tablet (800 mg total) by mouth every 8 (eight) hours as needed. 09/01/16   Lawyer, Lonni, PA-C  lidocaine  (XYLOCAINE ) 2 % solution Use as directed 15 mLs in the mouth or throat as needed for mouth pain. 04/26/17   Khatri, Hina, PA-C  naphazoline-pheniramine (NAPHCON-A) 0.025-0.3 % ophthalmic solution Place 1 drop into the right eye 4 (four) times daily as needed for eye irritation. 04/26/17   Khatri, Hina, PA-C  predniSONE  (STERAPRED UNI-PAK 21 TAB) 10 MG (21) TBPK tablet Take 6 tabs by mouth daily  for 2 days, then 5 tabs for 2 days, then 4 tabs for 2 days, then 3 tabs for 2 days, 2 tabs for 2 days, then 1 tab by mouth daily for 2 days Patient not taking: Reported on 03/17/2017 02/07/17   Zada Elouise BROCKS, PA-C    Allergies: Shellfish allergy and Sulfa  antibiotics    Review of Systems  Constitutional:  Negative for fever.  Gastrointestinal:  Positive for abdominal pain, diarrhea, nausea and vomiting. Negative for blood in stool.    Updated Vital Signs BP 133/83   Pulse 76   Temp 97.9 F (36.6 C) (Oral)   Resp 12   Ht 6' 2 (1.88 m)  Wt 126.1 kg   SpO2 98%   BMI 35.69 kg/m   Physical Exam Vitals and nursing note reviewed.  Constitutional:      Appearance: He is well-developed. He is not diaphoretic.  HENT:     Head: Normocephalic and atraumatic.  Cardiovascular:     Rate and Rhythm: Normal rate and regular rhythm.     Heart sounds: Normal heart sounds.  Pulmonary:     Effort: Pulmonary effort is normal.     Breath sounds: Normal breath sounds.  Abdominal:     Palpations: Abdomen is soft.     Tenderness: There is abdominal tenderness.     Comments: Mild generalized tenderness  Skin:    General: Skin is warm and dry.  Neurological:     Mental Status: He is alert.      (all labs ordered are listed, but only abnormal results are displayed) Labs Reviewed  COMPREHENSIVE METABOLIC PANEL WITH GFR - Abnormal; Notable for the following components:      Result Value   CO2 19 (*)    Glucose, Bld 129 (*)    All other components within normal limits  CBC - Abnormal; Notable for the following components:   WBC 13.1 (*)    RBC 5.82 (*)    MCV 78.7 (*)    MCH 25.8 (*)    RDW 15.6 (*)    All other components within normal limits  LIPASE, BLOOD  URINALYSIS, ROUTINE W REFLEX MICROSCOPIC    EKG: EKG Interpretation Date/Time:  Tuesday January 11 2024 09:20:47 EDT Ventricular Rate:  63 PR Interval:  145 QRS Duration:  101 QT Interval:  394 QTC Calculation: 404 R Axis:   71  Text Interpretation: Sinus rhythm no acute ST/T changes Confirmed by Freddi Hamilton [phone] on 01/11/2024 9:52:28 AM  Radiology: CT ABDOMEN PELVIS W CONTRAST Result Date: 01/11/2024 CLINICAL DATA:  Possible bowel obstruction. Abdominal pain with nausea and diarrhea since this morning. EXAM: CT ABDOMEN AND PELVIS WITH CONTRAST TECHNIQUE: Multidetector CT imaging of the abdomen and pelvis was performed using the standard protocol following bolus administration of intravenous contrast. RADIATION DOSE REDUCTION: This exam was performed according to the departmental dose-optimization program which includes automated exposure control, adjustment of the mA and/or kV according to patient size and/or use of iterative reconstruction technique. CONTRAST:  OMNIPAQUE  IOHEXOL  300 MG/ML  SOLN COMPARISON:  11/09/2022 FINDINGS: Lower chest: Heart is normal size. Lung bases demonstrate no acute process. Minimal focal subpleural scarring/atelectasis adjacent the minor fissure which is stable. Hepatobiliary: Cholelithiasis including 2.5 cm stone near the gallbladder neck. Liver and biliary tree are unremarkable. Pancreas: Normal. Spleen: Normal. Adrenals/Urinary Tract: Adrenal glands are normal. Kidneys  are normal size without hydronephrosis or nephrolithiasis. Ureters and bladder are normal. Stomach/Bowel: Stomach and small bowel are normal. Appendix is normal. Colon is normal. Vascular/Lymphatic: Abdominal aorta is normal in caliber. Remaining vascular structures are unremarkable. No adenopathy. Reproductive: Prostate is unremarkable. Other: No free fluid or focal inflammatory change. 2 small adjacent ventral hernias over the midline above the umbilicus containing only peritoneal fat unchanged. Musculoskeletal: No focal abnormality. IMPRESSION: 1. No acute findings in the abdomen/pelvis. 2. Cholelithiasis. 3. Two small adjacent ventral hernias over the midline above the umbilicus containing only peritoneal fat unchanged. Electronically Signed   By: Toribio Agreste M.D.   On: 01/11/2024 11:23     Procedures   Medications Ordered in the ED  HYDROmorphone (DILAUDID) injection 1 mg (1 mg Intravenous Given 01/11/24 0925)  metoCLOPramide (REGLAN) injection  10 mg (10 mg Intravenous Given 01/11/24 0924)  sodium chloride  0.9 % bolus 1,000 mL (0 mLs Intravenous Stopped 01/11/24 1040)  droperidol (INAPSINE) 2.5 MG/ML injection 1.25 mg (1.25 mg Intravenous Given 01/11/24 1011)  iohexol  (OMNIPAQUE ) 300 MG/ML solution 100 mL (100 mLs Intravenous Contrast Given 01/11/24 1101)                                    Medical Decision Making Amount and/or Complexity of Data Reviewed External Data Reviewed: notes. Labs: ordered.    Details: Leukocytosis Radiology: ordered and independent interpretation performed.    Details: No bowel obstruction ECG/medicine tests: ordered and independent interpretation performed.    Details: Normal QTc  Risk Prescription drug management.   Patient presents with what is probably cannabinoid hyperemesis syndrome.  He did feel Translet better after pain and nausea meds but then the pain came back and was worse.  Chart review shows he has had some abdominal hernias and so CT was  obtained to help rule out obstruction given worsening symptoms.  However after droperidol his symptoms have now resolved.  Repeat abdominal exam is benign.  Does have some cholelithiasis but no right upper quadrant tenderness on exam, LFTs unremarkable, I highly doubt that this is problem today but will refer him to general surgery as an outpatient.  Otherwise I have counseled him to stop using marijuana/THC.  Will discharge with antiemetics.  Appears stable for discharge home with return precautions.     Final diagnoses:  Cannabinoid hyperemesis syndrome  Calculus of gallbladder without cholecystitis without obstruction    ED Discharge Orders          Ordered    ondansetron  (ZOFRAN -ODT) 4 MG disintegrating tablet  Every 8 hours PRN        01/11/24 1145    metoCLOPramide (REGLAN) 10 MG tablet  Every 8 hours PRN        01/11/24 1145               Freddi Hamilton, MD 01/11/24 1530

## 2024-01-14 ENCOUNTER — Emergency Department (HOSPITAL_BASED_OUTPATIENT_CLINIC_OR_DEPARTMENT_OTHER)
Admission: EM | Admit: 2024-01-14 | Discharge: 2024-01-14 | Discharge status: Against Medical Advice | Payer: Self-pay | Attending: Emergency Medicine | Admitting: Emergency Medicine

## 2024-01-14 DIAGNOSIS — F419 Anxiety disorder, unspecified: Secondary | ICD-10-CM | POA: Insufficient documentation

## 2024-01-14 DIAGNOSIS — Z5321 Procedure and treatment not carried out due to patient leaving prior to being seen by health care provider: Secondary | ICD-10-CM | POA: Insufficient documentation

## 2024-01-14 NOTE — ED Notes (Addendum)
 Seen recently for emesis. Reports being anxious, restless since 9/30 visit. Was sent home with reglan and zofran , but has not taken. Denies CP SOB. Emesis has resolved. Continues to smoke weed daily. No new life stressors.

## 2024-01-14 NOTE — ED Notes (Signed)
 Patient to desk reporting he has to be somewhere at 12 Patient reporting he really only had one question about if Reglan can cause anxiety RN spoke with patient who verbalized still wanting to leave. Patient was in NAD and able to ambulate out of ED.

## 2024-01-14 NOTE — ED Provider Notes (Signed)
 This patient left the department prior to my evaluation stating that he had similar to be at midnight.  Ambulated with steady gait   Summer, Parthasarathy, DO 01/14/24 2244

## 2024-01-15 ENCOUNTER — Encounter (HOSPITAL_BASED_OUTPATIENT_CLINIC_OR_DEPARTMENT_OTHER): Payer: Self-pay

## 2024-01-15 ENCOUNTER — Emergency Department (HOSPITAL_BASED_OUTPATIENT_CLINIC_OR_DEPARTMENT_OTHER)
Admission: EM | Admit: 2024-01-15 | Discharge: 2024-01-15 | Disposition: A | Discharge status: Home / Self Care | Payer: Self-pay | Attending: Emergency Medicine | Admitting: Emergency Medicine

## 2024-01-15 DIAGNOSIS — F419 Anxiety disorder, unspecified: Secondary | ICD-10-CM | POA: Insufficient documentation

## 2024-01-15 MED ORDER — HYDROXYZINE HCL 25 MG PO TABS
50.0000 mg | ORAL_TABLET | Freq: Once | ORAL | Status: AC
  Administered 2024-01-15: 50 mg via ORAL
  Filled 2024-01-15: qty 2

## 2024-01-15 MED ORDER — HYDROXYZINE HCL 25 MG PO TABS: 25.0000 mg | ORAL_TABLET | Freq: Four times a day (QID) | ORAL | 0 refills | Status: AC | PRN

## 2024-01-15 NOTE — ED Notes (Signed)
 Discharge instructions reviewed with patient. Patient verbalizes understanding, no further questions at this time. Medications/prescriptions and follow up information provided. No acute distress noted at time of departure.

## 2024-01-15 NOTE — ED Provider Notes (Signed)
 [place] [facility] AT [facility] [place] Provider Note   CSN: [id number] Arrival date & time: 01/15/24  1122     Patient presents with: Anxiety   Jeremy Horn is a 33 y.o. male otherwise healthy presents with complaints of anxiety.  Patient reports that his anxiety started on 9/30 when he was evaluated here for abdominal pain and vomiting.  His workup was overall reassuring.  His symptoms were felt to be consistent with possible cannabis hyperemesis.  He had improvement of symptoms with droperidol and Reglan.  His workup was overall reassuring.  Reports that since discharge he has been feeling nervous.  He denies any chest pain, shortness of breath, abdominal pain.  No persistent nausea or vomiting.  Reports that he has not had any cannabis since being told to discontinue.  Denies any SI/HI.  No audible hallucinations.  No headaches, vision changes difficulty ambulating or walking.  He is not using any alcohol or drugs.  Last drink was several months ago.    Anxiety   Past Medical History:  Diagnosis Date   Anxiety    History reviewed. No pertinent surgical history.     Past Medical History:  Diagnosis Date   Anxiety    History reviewed. No pertinent surgical history.   Prior to Admission medications   Medication Sig Start Date End Date Taking? Authorizing Provider  cephALEXin  (KEFLEX ) 500 MG capsule Take 1 capsule (500 mg total) by mouth 4 (four) times daily. Patient not taking: Reported on 03/17/2017 02/14/17   Pisciotta, Nat, PA-C  clindamycin  (CLEOCIN ) 150 MG capsule Take 1 capsule (150 mg total) by mouth every 6 (six) hours. Patient not taking: Reported on 03/17/2017 02/06/17   Dasie Faden, MD  diphenhydrAMINE  (BENADRYL ) 25 mg capsule Take 1 capsule (25 mg total) by mouth every 6 (six) hours as needed for itching. 02/07/17   Joy, Shawn C, PA-C  erythromycin  ophthalmic ointment Place a 1/2 inch ribbon of ointment into the lower eyelid. 04/26/17    Khatri, Hina, PA-C  famotidine  (PEPCID ) 20 MG tablet Take 1 tablet (20 mg total) by mouth 2 (two) times daily. 03/13/17   Robinson, Jordan N, PA-C  hydrOXYzine  (ATARAX ) 25 MG tablet Take 1 tablet (25 mg total) by mouth every 6 (six) hours as needed for anxiety. 01/15/24   Donnajean Lynwood DEL, PA-C  ibuprofen  (ADVIL ,MOTRIN ) 800 MG tablet Take 1 tablet (800 mg total) by mouth every 8 (eight) hours as needed. 09/01/16   Lawyer, Lonni, PA-C  lidocaine  (XYLOCAINE ) 2 % solution Use as directed 15 mLs in the mouth or throat as needed for mouth pain. 04/26/17   Khatri, Hina, PA-C  metoCLOPramide (REGLAN) 10 MG tablet Take 1 tablet (10 mg total) by mouth every 8 (eight) hours as needed for nausea. 01/11/24   Freddi Hamilton, MD  naphazoline-pheniramine (NAPHCON-A) 0.025-0.3 % ophthalmic solution Place 1 drop into the right eye 4 (four) times daily as needed for eye irritation. 04/26/17   Khatri, Hina, PA-C  ondansetron  (ZOFRAN -ODT) 4 MG disintegrating tablet Take 1 tablet (4 mg total) by mouth every 8 (eight) hours as needed for nausea or vomiting. 01/11/24   Freddi Hamilton, MD  predniSONE  (STERAPRED UNI-PAK 21 TAB) 10 MG (21) TBPK tablet Take 6 tabs by mouth daily  for 2 days, then 5 tabs for 2 days, then 4 tabs for 2 days, then 3 tabs for 2 days, 2 tabs for 2 days, then 1 tab by mouth daily for 2 days Patient not taking: Reported on  03/17/2017 02/07/17   Joy, Elouise BROCKS, PA-C    Allergies: Shellfish allergy and Sulfa  antibiotics    Review of Systems  Psychiatric/Behavioral:  The patient is nervous/anxious.     Updated Vital Signs BP 110/71 (BP Location: Right Arm)   Pulse (!) 58   Temp 98 F (36.7 C) (Oral)   Resp 16   Ht 6' 2 (1.88 m)   Wt 126.1 kg   SpO2 97%   BMI 35.69 kg/m   Physical Exam Vitals and nursing note reviewed.  Constitutional:      General: He is not in acute distress.    Appearance: He is well-developed.  HENT:     Head: Normocephalic and atraumatic.  Eyes:      Conjunctiva/sclera: Conjunctivae normal.  Cardiovascular:     Rate and Rhythm: Normal rate and regular rhythm.     Heart sounds: No murmur heard. Pulmonary:     Effort: Pulmonary effort is normal. No respiratory distress.     Breath sounds: Normal breath sounds.  Abdominal:     Palpations: Abdomen is soft.     Tenderness: There is no abdominal tenderness.  Musculoskeletal:        General: No swelling.     Cervical back: Neck supple.  Skin:    General: Skin is warm and dry.     Capillary Refill: Capillary refill takes less than 2 seconds.  Neurological:     Mental Status: He is alert.  Psychiatric:        Mood and Affect: Mood normal.     (all labs ordered are listed, but only abnormal results are displayed) Labs Reviewed - No data to display  EKG: EKG Interpretation Date/Time:  Saturday January 15 2024 11:52:30 EDT Ventricular Rate:  60 PR Interval:  163 QRS Duration:  99 QT Interval:  423 QTC Calculation: 423 R Axis:   66  Text Interpretation: Sinus rhythm Normal ECG No significant change since last tracing Confirmed by Roselyn Dunnings [phone]) on 01/15/2024 11:53:38 AM  Radiology: No results found.   Procedures   Medications Ordered in the ED  hydrOXYzine  (ATARAX ) tablet 50 mg (50 mg Oral Given 01/15/24 1153)    Clinical Course as of 01/15/24 1205  Sat Jan 15, 2024  1153 Patient evaluated for complaints of anxiety since recent ER visit for abdominal pain and vomiting was felt to be consistent with cannabis hyperemesis and treated with droperidol and Reglan.  Patient is hemodynamically stable.  His exam is entirely benign.  He has no history of anxiety.  He has completely discontinued cannabis use since being encouraged to stop his prior ER visit.  His symptoms very well may represent withdrawal versus iatrogenic effect from the antiemetics.  Will obtain EKG to check QTc and give a dose of hydroxyzine . [JT]  1204 EKG without any evidence of QT prolongation.  Will  send a prescription of hydroxyzine .  Encourage patient to continue abstinence from cannabis.  He is understanding agreement with plan.  Strict return precautions provided.  Symptoms persist he will follow-up with his PCP. [JT]    Clinical Course User Index [JT] Donnajean Lynwood DEL, PA-C                                 Medical Decision Making  This patient presents to the ED with chief complaint(s) of anxiety.  The complaint involves an extensive differential diagnosis and also carries with it a high  risk of complications and morbidity.   Pertinent past medical history as listed in HPI  The differential diagnosis includes  Withdrawal, intoxication, medication adverse effect Additional history obtained: Records reviewed Care Everywhere/External Records  Disposition:     Social Determinants of Health:   none  This note was dictated with voice recognition software.  Despite best efforts at proofreading, errors may have occurred which can change the documentation meaning.       Final diagnoses:  Anxiety    ED Discharge Orders          Ordered    hydrOXYzine  (ATARAX ) 25 MG tablet  Every 6 hours PRN        01/15/24 1200               Donnajean Lynwood VEAR DEVONNA 01/15/24 1205    Roselyn Carlin NOVAK, MD 01/15/24 [phone]

## 2024-01-15 NOTE — ED Triage Notes (Signed)
 Pt states he was seen in ED on 9/30 for abdominal pain. Since leaving he has intermittent anxiety problems. Denies hx of anxiety. Denies other symptoms.

## 2024-01-15 NOTE — Discharge Instructions (Signed)
 You were evaluated emergency room for anxiety.  Your EKG did not show any significant abnormality.  Your symptoms may be secondary from withdrawal or adverse effects from the medications that you are administered.  A prescription for hydroxyzine  was sent into your pharmacy.  You may take this over the next few days to help alleviate your symptoms.  If your symptoms persist please follow-up with your primary care doctor.  If you experience any new or worsening symptoms please return emergency room.

## 2024-01-16 ENCOUNTER — Emergency Department (HOSPITAL_BASED_OUTPATIENT_CLINIC_OR_DEPARTMENT_OTHER)
Admission: EM | Admit: 2024-01-16 | Discharge: 2024-01-16 | Disposition: A | Discharge status: Home / Self Care | Payer: Self-pay | Attending: Emergency Medicine | Admitting: Emergency Medicine

## 2024-01-16 ENCOUNTER — Other Ambulatory Visit: Payer: Self-pay

## 2024-01-16 DIAGNOSIS — R112 Nausea with vomiting, unspecified: Secondary | ICD-10-CM | POA: Insufficient documentation

## 2024-01-16 DIAGNOSIS — R1084 Generalized abdominal pain: Secondary | ICD-10-CM | POA: Insufficient documentation

## 2024-01-16 LAB — COMPREHENSIVE METABOLIC PANEL WITH GFR
ALT: 22 U/L (ref 0–44)
AST: 21 U/L (ref 15–41)
Albumin: 3.9 g/dL (ref 3.5–5.0)
Alkaline Phosphatase: 68 U/L (ref 38–126)
Anion gap: 14 (ref 5–15)
BUN: 9 mg/dL (ref 6–20)
CO2: 19 mmol/L — ABNORMAL LOW (ref 22–32)
Calcium: 8.8 mg/dL — ABNORMAL LOW (ref 8.9–10.3)
Chloride: 108 mmol/L (ref 98–111)
Creatinine, Ser: 0.98 mg/dL (ref 0.61–1.24)
GFR, Estimated: >60 mL/min (ref >60)
Glucose, Bld: 138 mg/dL — ABNORMAL HIGH (ref 70–99)
Potassium: 3.6 mmol/L (ref 3.5–5.1)
Sodium: 142 mmol/L (ref 135–145)
Total Bilirubin: 0.2 mg/dL (ref 0.0–1.2)
Total Protein: 6.4 g/dL — ABNORMAL LOW (ref 6.5–8.1)

## 2024-01-16 LAB — CBC WITH DIFFERENTIAL/PLATELET
Abs Immature Granulocytes: 0.08 K/uL — ABNORMAL HIGH (ref 0.00–0.07)
Basophils Absolute: 0.1 K/uL (ref 0.0–0.1)
Basophils Relative: 0 %
Eosinophils Absolute: 0.1 K/uL (ref 0.0–0.5)
Eosinophils Relative: 1 %
HCT: 46.8 % (ref 39.0–52.0)
Hemoglobin: 15.8 g/dL (ref 13.0–17.0)
Immature Granulocytes: 1 %
Lymphocytes Relative: 18 %
Lymphs Abs: 2.5 K/uL (ref 0.7–4.0)
MCH: 26 pg (ref 26.0–34.0)
MCHC: 33.8 g/dL (ref 30.0–36.0)
MCV: 77.1 fL — ABNORMAL LOW (ref 80.0–100.0)
Monocytes Absolute: 0.8 K/uL (ref 0.1–1.0)
Monocytes Relative: 6 %
Neutro Abs: 10.2 K/uL — ABNORMAL HIGH (ref 1.7–7.7)
Neutrophils Relative %: 74 %
Platelets: 308 K/uL (ref 150–400)
RBC: 6.07 MIL/uL — ABNORMAL HIGH (ref 4.22–5.81)
RDW: 15.5 % (ref 11.5–15.5)
WBC: 13.8 K/uL — ABNORMAL HIGH (ref 4.0–10.5)
nRBC: 0 % (ref 0.0–0.2)

## 2024-01-16 LAB — LIPASE, BLOOD: Lipase: 37 U/L (ref 11–51)

## 2024-01-16 MED ORDER — KETOROLAC TROMETHAMINE 30 MG/ML IJ SOLN
30.0000 mg | Freq: Once | INTRAMUSCULAR | Status: AC
  Administered 2024-01-16: 30 mg via INTRAVENOUS
  Filled 2024-01-16: qty 1

## 2024-01-16 MED ORDER — SODIUM CHLORIDE 0.9 % IV BOLUS
1000.0000 mL | Freq: Once | INTRAVENOUS | Status: AC
  Administered 2024-01-16: 1000 mL via INTRAVENOUS

## 2024-01-16 MED ORDER — DROPERIDOL 2.5 MG/ML IJ SOLN
2.5000 mg | Freq: Once | INTRAMUSCULAR | Status: AC
  Administered 2024-01-16: 2.5 mg via INTRAVENOUS
  Filled 2024-01-16: qty 2

## 2024-01-16 MED ORDER — ONDANSETRON 8 MG PO TBDP: ORAL_TABLET | ORAL | 0 refills | Status: AC

## 2024-01-16 MED ORDER — ONDANSETRON HCL 4 MG/2ML IJ SOLN
4.0000 mg | Freq: Once | INTRAMUSCULAR | Status: AC
  Administered 2024-01-16: 4 mg via INTRAVENOUS
  Filled 2024-01-16: qty 2

## 2024-01-16 NOTE — ED Triage Notes (Addendum)
 Patient presents with complaints of abd pain and N/V. Had eaten hotdog and uses [employer]. And about 1 hour later symptoms started  Onset : 1030pm  Denies Fever.or sick contact. At [facility] yesterday for Anxiety attack. Patient says this is not related to anxiety issues.

## 2024-01-16 NOTE — ED Provider Notes (Signed)
 [place] [facility] [place] Provider Note   CSN: [id number] Arrival date & time: 01/16/24  0235     Patient presents with: Abdominal Pain   Jeremy Horn is a 33 y.o. male.   Patient is a 33 year old male presenting with complaints of nausea, vomiting, and generalized abdominal pain.  He was seen here on Tuesday, then again yesterday with similar complaints.  CT scan on initial visit was unremarkable and symptoms felt to be related to cannabis use.  Patient's symptoms returned this evening.  He describes severe, generalized abdominal pain along with multiple episodes of nausea.  He does report using marijuana again after his previous visits and used marijuana this evening.       Prior to Admission medications   Medication Sig Start Date End Date Taking? Authorizing Provider  cephALEXin  (KEFLEX ) 500 MG capsule Take 1 capsule (500 mg total) by mouth 4 (four) times daily. Patient not taking: Reported on 03/17/2017 02/14/17   Pisciotta, Nat, PA-C  clindamycin  (CLEOCIN ) 150 MG capsule Take 1 capsule (150 mg total) by mouth every 6 (six) hours. Patient not taking: Reported on 03/17/2017 02/06/17   Dasie Faden, MD  diphenhydrAMINE  (BENADRYL ) 25 mg capsule Take 1 capsule (25 mg total) by mouth every 6 (six) hours as needed for itching. 02/07/17   Joy, Shawn C, PA-C  erythromycin  ophthalmic ointment Place a 1/2 inch ribbon of ointment into the lower eyelid. 04/26/17   Khatri, Hina, PA-C  famotidine  (PEPCID ) 20 MG tablet Take 1 tablet (20 mg total) by mouth 2 (two) times daily. 03/13/17   Robinson, [place] N, PA-C  hydrOXYzine  (ATARAX ) 25 MG tablet Take 1 tablet (25 mg total) by mouth every 6 (six) hours as needed for anxiety. 01/15/24   Donnajean Lynwood DEL, PA-C  ibuprofen  (ADVIL ,MOTRIN ) 800 MG tablet Take 1 tablet (800 mg total) by mouth every 8 (eight) hours as needed. 09/01/16   Lawyer, Lonni, PA-C  lidocaine  (XYLOCAINE ) 2 % solution Use as directed 15  mLs in the mouth or throat as needed for mouth pain. 04/26/17   Khatri, Hina, PA-C  metoCLOPramide (REGLAN) 10 MG tablet Take 1 tablet (10 mg total) by mouth every 8 (eight) hours as needed for nausea. 01/11/24   Freddi Hamilton, MD  naphazoline-pheniramine (NAPHCON-A) 0.025-0.3 % ophthalmic solution Place 1 drop into the right eye 4 (four) times daily as needed for eye irritation. 04/26/17   Khatri, Hina, PA-C  ondansetron  (ZOFRAN -ODT) 4 MG disintegrating tablet Take 1 tablet (4 mg total) by mouth every 8 (eight) hours as needed for nausea or vomiting. 01/11/24   Freddi Hamilton, MD  predniSONE  (STERAPRED UNI-PAK 21 TAB) 10 MG (21) TBPK tablet Take 6 tabs by mouth daily  for 2 days, then 5 tabs for 2 days, then 4 tabs for 2 days, then 3 tabs for 2 days, 2 tabs for 2 days, then 1 tab by mouth daily for 2 days Patient not taking: Reported on 03/17/2017 02/07/17   Zada Elouise BROCKS, PA-C    Allergies: Shellfish allergy and Sulfa  antibiotics    Review of Systems  All other systems reviewed and are negative.   Updated Vital Signs BP 122/73 (BP Location: Right Arm)   Pulse 68   Temp 98 F (36.7 C) (Oral)   Ht 6' 2 (1.88 m)   Wt 126.1 kg   SpO2 100%   BMI 35.69 kg/m   Physical Exam Vitals and nursing note reviewed.  Constitutional:      General: He  is not in acute distress.    Appearance: He is well-developed. He is not diaphoretic.  HENT:     Head: Normocephalic and atraumatic.  Cardiovascular:     Rate and Rhythm: Normal rate and regular rhythm.     Heart sounds: No murmur heard.    No friction rub.  Pulmonary:     Effort: Pulmonary effort is normal. No respiratory distress.     Breath sounds: Normal breath sounds. No wheezing or rales.  Abdominal:     General: Bowel sounds are normal. There is no distension.     Palpations: Abdomen is soft.     Tenderness: There is abdominal tenderness in the epigastric area. There is no right CVA tenderness, left CVA tenderness, guarding or rebound.   Musculoskeletal:        General: Normal range of motion.     Cervical back: Normal range of motion and neck supple.  Skin:    General: Skin is warm and dry.  Neurological:     Mental Status: He is alert and oriented to person, place, and time.     Coordination: Coordination normal.     (all labs ordered are listed, but only abnormal results are displayed) Labs Reviewed - No data to display  EKG: None  Radiology: No results found.   Procedures   Medications Ordered in the ED  ondansetron  (ZOFRAN ) injection 4 mg (has no administration in time range)  ketorolac  (TORADOL ) 30 MG/ML injection 30 mg (has no administration in time range)  droperidol (INAPSINE) 2.5 MG/ML injection 2.5 mg (has no administration in time range)  sodium chloride  0.9 % bolus 1,000 mL (has no administration in time range)                                    Medical Decision Making Amount and/or Complexity of Data Reviewed Labs: ordered.  Risk Prescription drug management.   Patient presenting here with complaints of abdominal pain and nausea and vomiting.  Symptoms began acutely this evening.  He has had several episodes this week with similar presentation felt related to hyperemesis cannabis.  Patient arrives with stable vital signs and is afebrile.  He appears uncomfortable and is actively vomiting.  Laboratory studies obtained including CBC, CMP, and lipase, all of which are unremarkable.  Patient given IV fluids along with Toradol  and Zofran  and is now asleep.  He is feeling much improved.  Patient to be discharged with Zofran  and as needed return.     Final diagnoses:  None    ED Discharge Orders     None          Geroldine Berg, MD 01/16/24 [phone]

## 2024-01-16 NOTE — Discharge Instructions (Signed)
 Begin taking Zofran  as prescribed as needed for nausea.  Refrain from marijuana use.  Follow-up with primary doctor and return to the ER if symptoms significantly worsen or change.

## 2024-01-22 ENCOUNTER — Emergency Department (HOSPITAL_BASED_OUTPATIENT_CLINIC_OR_DEPARTMENT_OTHER)
Admission: EM | Admit: 2024-01-22 | Discharge: 2024-01-22 | Disposition: A | Discharge status: Home / Self Care | Payer: Self-pay

## 2024-01-22 ENCOUNTER — Encounter (HOSPITAL_BASED_OUTPATIENT_CLINIC_OR_DEPARTMENT_OTHER): Payer: Self-pay

## 2024-01-22 ENCOUNTER — Emergency Department (HOSPITAL_BASED_OUTPATIENT_CLINIC_OR_DEPARTMENT_OTHER): Payer: Self-pay

## 2024-01-22 ENCOUNTER — Other Ambulatory Visit: Payer: Self-pay

## 2024-01-22 DIAGNOSIS — D72829 Elevated white blood cell count, unspecified: Secondary | ICD-10-CM | POA: Insufficient documentation

## 2024-01-22 DIAGNOSIS — K8020 Calculus of gallbladder without cholecystitis without obstruction: Secondary | ICD-10-CM | POA: Insufficient documentation

## 2024-01-22 LAB — CBC
HCT: 48.4 % (ref 39.0–52.0)
Hemoglobin: 16.2 g/dL (ref 13.0–17.0)
MCH: 25.6 pg — ABNORMAL LOW (ref 26.0–34.0)
MCHC: 33.5 g/dL (ref 30.0–36.0)
MCV: 76.6 fL — ABNORMAL LOW (ref 80.0–100.0)
Platelets: 294 K/uL (ref 150–400)
RBC: 6.32 MIL/uL — ABNORMAL HIGH (ref 4.22–5.81)
RDW: 15.1 % (ref 11.5–15.5)
WBC: 12.2 K/uL — ABNORMAL HIGH (ref 4.0–10.5)
nRBC: 0 % (ref 0.0–0.2)

## 2024-01-22 LAB — COMPREHENSIVE METABOLIC PANEL WITH GFR
ALT: 17 U/L (ref 0–44)
AST: 19 U/L (ref 15–41)
Albumin: 3.9 g/dL (ref 3.5–5.0)
Alkaline Phosphatase: 56 U/L (ref 38–126)
Anion gap: 11 (ref 5–15)
BUN: 8 mg/dL (ref 6–20)
CO2: 20 mmol/L — ABNORMAL LOW (ref 22–32)
Calcium: 8.9 mg/dL (ref 8.9–10.3)
Chloride: 106 mmol/L (ref 98–111)
Creatinine, Ser: 0.92 mg/dL (ref 0.61–1.24)
GFR, Estimated: >60 mL/min (ref >60)
Glucose, Bld: 113 mg/dL — ABNORMAL HIGH (ref 70–99)
Potassium: 4.1 mmol/L (ref 3.5–5.1)
Sodium: 137 mmol/L (ref 135–145)
Total Bilirubin: 0.3 mg/dL (ref 0.0–1.2)
Total Protein: 6.4 g/dL — ABNORMAL LOW (ref 6.5–8.1)

## 2024-01-22 LAB — URINALYSIS, ROUTINE W REFLEX MICROSCOPIC
Bilirubin Urine: NEGATIVE
Glucose, UA: NEGATIVE mg/dL
Ketones, ur: 40 mg/dL — AB
Leukocytes,Ua: NEGATIVE
Nitrite: NEGATIVE
Protein, ur: NEGATIVE mg/dL
Specific Gravity, Urine: 1.02 (ref 1.005–1.030)
pH: 8.5 — ABNORMAL HIGH (ref 5.0–8.0)

## 2024-01-22 LAB — URINALYSIS, MICROSCOPIC (REFLEX)

## 2024-01-22 LAB — LIPASE, BLOOD: Lipase: 29 U/L (ref 11–51)

## 2024-01-22 MED ORDER — KETOROLAC TROMETHAMINE 15 MG/ML IJ SOLN
15.0000 mg | Freq: Once | INTRAMUSCULAR | Status: AC
  Administered 2024-01-22: 15 mg via INTRAVENOUS
  Filled 2024-01-22: qty 1

## 2024-01-22 NOTE — Discharge Instructions (Addendum)
 Your ultrasound today showed that you have a gallstone in your gallbladder.  Sometimes, when this stone moves around in the gallbladder, it can cause pain.  I suspect this is the cause of your pain today.  There are no signs of infection in your gallbladder and no indication for emergent removal of your gallbladder.  However, you need to follow-up with the general surgery office listed below as soon as possible for further evaluation of this gallstone.  Sometimes, they recommend taking out the gallbladder non-emergently to prevent further episodes of pain and complications.  Please avoid fatty foods as this can make gallbladder pain worse.  You may take up to 1000mg  of tylenol  every 6 hours as needed for pain.  Do not take more then 4g per day.  You may use up to 600mg  ibuprofen  every 6 hours as needed for pain.  Do not exceed 2.4g of ibuprofen  per day.  You were given your first dose here today, you can take your next dose of ibuprofen  at 3:30 PM today  The remainder of your labs are reassuring.  Your kidney, liver, gallbladder, pancreas labs are normal.  Your electrolytes were normal.  Your urine did not show any signs of infection.  Please return to the emergency room if you have any worsening abdominal pain, vomiting, fevers, any other new or concerning symptoms.

## 2024-01-22 NOTE — ED Provider Notes (Signed)
 Jeremy Horn Provider Note   CSN: [id number] Arrival date & time: 01/22/24  9140     Patient presents with: Abdominal Pain   Jeremy Horn is a 33 y.o. male with no significant past medical history presents with concern for right upper quadrant abdominal pain that started this morning at about 5 AM.  He reports this woke him from his sleep.  He reports the pain is intermittent and sharp in nature.  Denies any associated nausea, vomiting, or diarrhea.  No fever or chills.  Denies any abdominal pain like this before.  Denies any previous abdominal surgeries.  Reports normal bowel movements, he did have a bowel movement this morning.  He thought this pain might be due to gas and took Gas-X this morning, but it has not helped with the symptoms. No chest pain or shortness of breath. He states he has discontinued use of marijuana since being told his nausea and vomiting episode at his last ER visit was likely due to his marijuana use.     Abdominal Pain      Prior to Admission medications   Medication Sig Start Date End Date Taking? Authorizing Provider  cephALEXin  (KEFLEX ) 500 MG capsule Take 1 capsule (500 mg total) by mouth 4 (four) times daily. Patient not taking: Reported on 03/17/2017 02/14/17   Pisciotta, Nat, PA-C  clindamycin  (CLEOCIN ) 150 MG capsule Take 1 capsule (150 mg total) by mouth every 6 (six) hours. Patient not taking: Reported on 03/17/2017 02/06/17   Dasie Faden, MD  diphenhydrAMINE  (BENADRYL ) 25 mg capsule Take 1 capsule (25 mg total) by mouth every 6 (six) hours as needed for itching. 02/07/17   Joy, Shawn C, PA-C  erythromycin  ophthalmic ointment Place a 1/2 inch ribbon of ointment into the lower eyelid. 04/26/17   Khatri, Hina, PA-C  famotidine  (PEPCID ) 20 MG tablet Take 1 tablet (20 mg total) by mouth 2 (two) times daily. 03/13/17   Robinson, [place] N, PA-C  hydrOXYzine  (ATARAX ) 25 MG tablet Take 1 tablet (25 mg  total) by mouth every 6 (six) hours as needed for anxiety. 01/15/24   Donnajean Lynwood DEL, PA-C  ibuprofen  (ADVIL ,MOTRIN ) 800 MG tablet Take 1 tablet (800 mg total) by mouth every 8 (eight) hours as needed. 09/01/16   Lawyer, Lonni, PA-C  lidocaine  (XYLOCAINE ) 2 % solution Use as directed 15 mLs in the mouth or throat as needed for mouth pain. 04/26/17   Khatri, Hina, PA-C  metoCLOPramide (REGLAN) 10 MG tablet Take 1 tablet (10 mg total) by mouth every 8 (eight) hours as needed for nausea. 01/11/24   Freddi Hamilton, MD  naphazoline-pheniramine (NAPHCON-A) 0.025-0.3 % ophthalmic solution Place 1 drop into the right eye 4 (four) times daily as needed for eye irritation. 04/26/17   Khatri, Hina, PA-C  ondansetron  (ZOFRAN -ODT) 8 MG disintegrating tablet 8mg  ODT q4 hours prn nausea 01/16/24   Geroldine Berg, MD  predniSONE  (STERAPRED UNI-PAK 21 TAB) 10 MG (21) TBPK tablet Take 6 tabs by mouth daily  for 2 days, then 5 tabs for 2 days, then 4 tabs for 2 days, then 3 tabs for 2 days, 2 tabs for 2 days, then 1 tab by mouth daily for 2 days Patient not taking: Reported on 03/17/2017 02/07/17   Zada Elouise BROCKS, PA-C    Allergies: Shellfish allergy and Sulfa  antibiotics    Review of Systems  Gastrointestinal:  Positive for abdominal pain.    Updated Vital Signs BP (!) 114/54 (BP Location:  Right Arm)   Pulse 72   Temp 97.8 F (36.6 C) (Oral)   Resp 17   SpO2 98%   Physical Exam Vitals and nursing note reviewed.  Constitutional:      General: He is not in acute distress.    Appearance: He is well-developed.     Comments: Curled on his left side, appears uncomfortable No vomiting  HENT:     Head: Normocephalic and atraumatic.  Eyes:     Conjunctiva/sclera: Conjunctivae normal.  Cardiovascular:     Rate and Rhythm: Normal rate and regular rhythm.     Heart sounds: No murmur heard. Pulmonary:     Effort: Pulmonary effort is normal. No respiratory distress.     Breath sounds: Normal breath sounds.   Abdominal:     General: There is no distension.     Palpations: Abdomen is soft.     Tenderness: There is no abdominal tenderness.     Comments: Right upper quadrant tenderness to palpation with mild guarding.  No rebound.  Negative Murphy sign.  No CVA tenderness to palpation bilaterally  Musculoskeletal:        General: No swelling.     Cervical back: Neck supple.  Skin:    General: Skin is warm and dry.     Capillary Refill: Capillary refill takes less than 2 seconds.  Neurological:     Mental Status: He is alert.  Psychiatric:        Mood and Affect: Mood normal.     (all labs ordered are listed, but only abnormal results are displayed) Labs Reviewed  COMPREHENSIVE METABOLIC PANEL WITH GFR - Abnormal; Notable for the following components:      Result Value   CO2 20 (*)    Glucose, Bld 113 (*)    Total Protein 6.4 (*)    All other components within normal limits  CBC - Abnormal; Notable for the following components:   WBC 12.2 (*)    RBC 6.32 (*)    MCV 76.6 (*)    MCH 25.6 (*)    All other components within normal limits  URINALYSIS, ROUTINE W REFLEX MICROSCOPIC - Abnormal; Notable for the following components:   pH 8.5 (*)    Hgb urine dipstick TRACE (*)    Ketones, ur 40 (*)    All other components within normal limits  URINALYSIS, MICROSCOPIC (REFLEX) - Abnormal; Notable for the following components:   Bacteria, UA FEW (*)    All other components within normal limits  LIPASE, BLOOD    EKG: None  Radiology: US  Abdomen Limited RUQ (LIVER/GB) Result Date: 01/22/2024 CLINICAL DATA:  Right upper quadrant pain. EXAM: ULTRASOUND ABDOMEN LIMITED RIGHT UPPER QUADRANT COMPARISON:  None Available. FINDINGS: Gallbladder: A 2.3 cm shadowing echogenic gallstone is seen within the gallbladder lumen. The gallbladder wall measures 2.5 mm in thickness. No sonographic Murphy sign noted by sonographer. Common bile duct: Diameter: 2.0 mm Liver: No focal lesion identified. There  is diffusely increased echogenicity of the liver parenchyma. Portal vein is patent on color Doppler imaging with normal direction of blood flow towards the liver. Other: The study is technically limited secondary to the patient's body habitus, as per the ultrasound technologist. IMPRESSION: 1. Cholelithiasis, without evidence of acute cholecystitis. 2. Hepatic steatosis. Electronically Signed   By: Suzen Dials M.D.   On: 01/22/2024 10:47     Procedures   Medications Ordered in the ED  ketorolac  (TORADOL ) 15 MG/ML injection 15 mg (15 mg Intravenous Given  01/22/24 0926)                                    Medical Decision Making Amount and/or Complexity of Data Reviewed Labs: ordered. Radiology: ordered.  Risk Prescription drug management.     Differential diagnosis includes but is not limited to Cannabinoid hyperemesis syndrome, acute cholecystitis, cholelithiasis, cholangitis, choledocholithiasis, peptic ulcer, gastritis, gastroenteritis, appendicitis, IBS, IBD, DKA, nephrolithiasis, UTI, pyelonephritis, pancreatitis, diverticulitis, mesenteric ischemia, abdominal aortic aneurysm, small bowel obstruction, volvulus, testicular torsion in males   ED Course:  Upon initial evaluation, patient appears slightly uncomfortable, laying curled up on his left side.  He has right upper quadrant abdominal tenderness to palpation with mild guarding.  Negative Murphy sign.  No CVA tenderness to palpation bilaterally.  No active vomiting.  He request pain medication, but would like to avoid narcotics.  Will start with Toradol .  Labs Ordered: I Ordered, and personally interpreted labs.  The pertinent results include:   CBC with leukocytosis of 12.2 CMP no elevation in LFTs, normal creatinine.  Normal electrolytes Lipase within normal limits Urinalysis without signs of infection.  No red blood cells or red blood cells noted  Imaging Studies ordered: I ordered imaging studies including right  upper quadrant ultrasound I independently visualized the imaging with scope of interpretation limited to determining acute life threatening conditions related to emergency care. Imaging showed  IMPRESSION:  1. Cholelithiasis, without evidence of acute cholecystitis.  2. Hepatic steatosis.   I agree with the radiologist interpretation   Medications Given: Toradol   Upon re-evaluation, patient feels improved with the Toradol  given.  Reports the pain has resolved.  He remains with stable vitals.  His workup is overall reassuring.  His right upper quadrant ultrasound does show a 2.3 cm gallstone, but no signs of acute cholecystitis.  No elevation in LFTs.  Suspect his pain may be due to biliary colic.  We discussed that he will need to follow-up with general surgery as an outpatient to see if he will need his gallbladder removed.  Otherwise, abdomen is soft and nontender, no concern for other acute intra-abdominal pathology.  He does not have any flank pain, no red blood cells or signs of infection on urinalysis, CT abdomen pelvis from 01/11/2024 did not reveal any nephrolithiasis, low concern for kidney stone.    He does have a leukocytosis of 12.2 on CBC, but no fever or tachycardia to suggest systemic infection/sepsis.  Patient is tolerating p.o. intake.  Stable and appropriate for discharge home at this time.    Impression: Right upper quadrant pain, likely biliary colic  Disposition:  The patient was discharged home with instructions to follow-up with general surgery as soon as possible for further evaluation of his right upper quadrant pain and gallstone seen here today.  I provided information for [facility] surgery, and patient understands he needs to call their office to schedule an appointment.  May take Tylenol  and ibuprofen  as needed for pain.  Counseled patient on avoiding fatty foods. Return precautions given.    Record Review: External records from outside source obtained and  reviewed including CT abdomen pelvis from 01/11/2024     This chart was dictated using voice recognition software, Dragon. Despite the best efforts of this provider to proofread and correct errors, errors may still occur which can change documentation meaning.       Final diagnoses:  Cholelithiasis without cholecystitis  ED Discharge Orders     None          Veta Palma, PA-C 01/22/24 1109    Neysa Caron PARAS, DO 01/22/24 1524

## 2024-01-22 NOTE — ED Triage Notes (Signed)
 Pt presents with RUQ abdominal pain that started this am. Pt denies N,V, D. Denies chest pain or SOB.
